# Patient Record
Sex: Female | Born: 1945 | Race: White | Hispanic: No | Marital: Married | Smoking: Never smoker
Health system: Southern US, Community
[De-identification: ages and names within clinical notes are randomized; demographics above are authoritative.]

## PROBLEM LIST (undated history)

## (undated) DIAGNOSIS — I771 Stricture of artery: Secondary | ICD-10-CM

## (undated) DIAGNOSIS — K5790 Diverticulosis of intestine, part unspecified, without perforation or abscess without bleeding: Secondary | ICD-10-CM

## (undated) DIAGNOSIS — Z889 Allergy status to unspecified drugs, medicaments and biological substances status: Secondary | ICD-10-CM

## (undated) DIAGNOSIS — K589 Irritable bowel syndrome without diarrhea: Secondary | ICD-10-CM

## (undated) DIAGNOSIS — G43909 Migraine, unspecified, not intractable, without status migrainosus: Secondary | ICD-10-CM

## (undated) DIAGNOSIS — M1990 Unspecified osteoarthritis, unspecified site: Secondary | ICD-10-CM

## (undated) DIAGNOSIS — R519 Headache, unspecified: Secondary | ICD-10-CM

## (undated) DIAGNOSIS — Z9289 Personal history of other medical treatment: Secondary | ICD-10-CM

## (undated) DIAGNOSIS — I10 Essential (primary) hypertension: Secondary | ICD-10-CM

## (undated) DIAGNOSIS — F419 Anxiety disorder, unspecified: Secondary | ICD-10-CM

## (undated) DIAGNOSIS — C801 Malignant (primary) neoplasm, unspecified: Secondary | ICD-10-CM

## (undated) DIAGNOSIS — Z9889 Other specified postprocedural states: Secondary | ICD-10-CM

## (undated) DIAGNOSIS — N6001 Solitary cyst of right breast: Secondary | ICD-10-CM

## (undated) DIAGNOSIS — H269 Unspecified cataract: Secondary | ICD-10-CM

## (undated) DIAGNOSIS — B019 Varicella without complication: Secondary | ICD-10-CM

## (undated) DIAGNOSIS — N6002 Solitary cyst of left breast: Secondary | ICD-10-CM

## (undated) DIAGNOSIS — L719 Rosacea, unspecified: Secondary | ICD-10-CM

## (undated) DIAGNOSIS — R42 Dizziness and giddiness: Secondary | ICD-10-CM

## (undated) DIAGNOSIS — J310 Chronic rhinitis: Secondary | ICD-10-CM

## (undated) DIAGNOSIS — J45909 Unspecified asthma, uncomplicated: Secondary | ICD-10-CM

## (undated) DIAGNOSIS — E785 Hyperlipidemia, unspecified: Secondary | ICD-10-CM

## (undated) DIAGNOSIS — K219 Gastro-esophageal reflux disease without esophagitis: Secondary | ICD-10-CM

## (undated) DIAGNOSIS — R51 Headache: Secondary | ICD-10-CM

## (undated) DIAGNOSIS — B351 Tinea unguium: Secondary | ICD-10-CM

## (undated) DIAGNOSIS — D649 Anemia, unspecified: Secondary | ICD-10-CM

## (undated) DIAGNOSIS — B029 Zoster without complications: Secondary | ICD-10-CM

## (undated) HISTORY — PX: JOINT REPLACEMENT: SHX530

## (undated) HISTORY — PX: ABDOMINAL HYSTERECTOMY: SHX81

## (undated) HISTORY — PX: NOSE SURGERY: SHX723

## (undated) HISTORY — PX: BREAST CYST ASPIRATION: SHX578

## (undated) HISTORY — PX: SHOULDER ARTHROSCOPY: SHX128

## (undated) HISTORY — PX: WRIST FRACTURE SURGERY: SHX121

## (undated) HISTORY — PX: FRACTURE SURGERY: SHX138

## (undated) HISTORY — PX: HIP ARTHROPLASTY: SHX981

---

## 2003-02-16 HISTORY — PX: ESOPHAGOGASTRODUODENOSCOPY: SHX1529

## 2005-03-19 ENCOUNTER — Ambulatory Visit: Payer: Self-pay | Admitting: Obstetrics and Gynecology

## 2006-06-17 ENCOUNTER — Ambulatory Visit: Payer: Self-pay | Admitting: Obstetrics and Gynecology

## 2007-06-21 ENCOUNTER — Ambulatory Visit: Payer: Self-pay | Admitting: Obstetrics and Gynecology

## 2008-10-30 ENCOUNTER — Ambulatory Visit: Payer: Self-pay | Admitting: Obstetrics and Gynecology

## 2010-01-22 ENCOUNTER — Ambulatory Visit: Payer: Self-pay | Admitting: Obstetrics and Gynecology

## 2010-07-31 ENCOUNTER — Ambulatory Visit: Payer: Self-pay | Admitting: Internal Medicine

## 2012-11-01 ENCOUNTER — Ambulatory Visit: Payer: Self-pay | Admitting: Unknown Physician Specialty

## 2012-11-07 ENCOUNTER — Ambulatory Visit: Payer: Self-pay | Admitting: Internal Medicine

## 2014-01-16 ENCOUNTER — Ambulatory Visit: Payer: Self-pay | Admitting: Family Medicine

## 2014-01-16 DIAGNOSIS — Z860101 Personal history of adenomatous and serrated colon polyps: Secondary | ICD-10-CM

## 2014-01-16 DIAGNOSIS — Z86010 Personal history of colonic polyps: Secondary | ICD-10-CM

## 2014-01-16 HISTORY — DX: Personal history of adenomatous and serrated colon polyps: Z86.0101

## 2014-01-16 HISTORY — DX: Personal history of colonic polyps: Z86.010

## 2014-07-14 DIAGNOSIS — E785 Hyperlipidemia, unspecified: Secondary | ICD-10-CM | POA: Insufficient documentation

## 2014-07-14 DIAGNOSIS — I10 Essential (primary) hypertension: Secondary | ICD-10-CM | POA: Insufficient documentation

## 2014-07-14 DIAGNOSIS — M1990 Unspecified osteoarthritis, unspecified site: Secondary | ICD-10-CM | POA: Insufficient documentation

## 2014-07-14 DIAGNOSIS — M8580 Other specified disorders of bone density and structure, unspecified site: Secondary | ICD-10-CM | POA: Insufficient documentation

## 2014-07-27 DIAGNOSIS — J45909 Unspecified asthma, uncomplicated: Secondary | ICD-10-CM | POA: Insufficient documentation

## 2015-02-20 ENCOUNTER — Ambulatory Visit: Payer: Self-pay | Admitting: Obstetrics and Gynecology

## 2015-05-31 ENCOUNTER — Ambulatory Visit
Admission: RE | Admit: 2015-05-31 | Discharge: 2015-05-31 | Disposition: A | Discharge status: Home / Self Care | Payer: Medicare Other | Source: Ambulatory Visit | Attending: Unknown Physician Specialty | Admitting: Unknown Physician Specialty

## 2015-05-31 ENCOUNTER — Ambulatory Visit: Payer: Medicare Other | Admitting: Anesthesiology

## 2015-05-31 ENCOUNTER — Encounter
Admission: RE | Disposition: A | Discharge status: Home / Self Care | Payer: Self-pay | Source: Ambulatory Visit | Attending: Unknown Physician Specialty

## 2015-05-31 ENCOUNTER — Encounter: Payer: Self-pay | Admitting: Certified Registered Nurse Anesthetist

## 2015-05-31 DIAGNOSIS — Z7982 Long term (current) use of aspirin: Secondary | ICD-10-CM | POA: Insufficient documentation

## 2015-05-31 DIAGNOSIS — Z1211 Encounter for screening for malignant neoplasm of colon: Secondary | ICD-10-CM | POA: Diagnosis not present

## 2015-05-31 DIAGNOSIS — E785 Hyperlipidemia, unspecified: Secondary | ICD-10-CM | POA: Diagnosis not present

## 2015-05-31 DIAGNOSIS — I10 Essential (primary) hypertension: Secondary | ICD-10-CM | POA: Insufficient documentation

## 2015-05-31 DIAGNOSIS — F419 Anxiety disorder, unspecified: Secondary | ICD-10-CM | POA: Insufficient documentation

## 2015-05-31 DIAGNOSIS — R51 Headache: Secondary | ICD-10-CM | POA: Insufficient documentation

## 2015-05-31 DIAGNOSIS — K5730 Diverticulosis of large intestine without perforation or abscess without bleeding: Secondary | ICD-10-CM | POA: Insufficient documentation

## 2015-05-31 DIAGNOSIS — D120 Benign neoplasm of cecum: Secondary | ICD-10-CM | POA: Diagnosis not present

## 2015-05-31 DIAGNOSIS — M1990 Unspecified osteoarthritis, unspecified site: Secondary | ICD-10-CM | POA: Insufficient documentation

## 2015-05-31 DIAGNOSIS — D122 Benign neoplasm of ascending colon: Secondary | ICD-10-CM | POA: Diagnosis not present

## 2015-05-31 DIAGNOSIS — Z86010 Personal history of colonic polyps: Secondary | ICD-10-CM | POA: Diagnosis not present

## 2015-05-31 DIAGNOSIS — D123 Benign neoplasm of transverse colon: Secondary | ICD-10-CM | POA: Insufficient documentation

## 2015-05-31 DIAGNOSIS — J45909 Unspecified asthma, uncomplicated: Secondary | ICD-10-CM | POA: Diagnosis not present

## 2015-05-31 DIAGNOSIS — Z79899 Other long term (current) drug therapy: Secondary | ICD-10-CM | POA: Diagnosis not present

## 2015-05-31 DIAGNOSIS — K219 Gastro-esophageal reflux disease without esophagitis: Secondary | ICD-10-CM | POA: Diagnosis not present

## 2015-05-31 DIAGNOSIS — K589 Irritable bowel syndrome without diarrhea: Secondary | ICD-10-CM | POA: Insufficient documentation

## 2015-05-31 DIAGNOSIS — K648 Other hemorrhoids: Secondary | ICD-10-CM | POA: Insufficient documentation

## 2015-05-31 HISTORY — DX: Stricture of artery: I77.1

## 2015-05-31 HISTORY — DX: Chronic rhinitis: J31.0

## 2015-05-31 HISTORY — DX: Malignant (primary) neoplasm, unspecified: C80.1

## 2015-05-31 HISTORY — DX: Unspecified cataract: H26.9

## 2015-05-31 HISTORY — PX: COLONOSCOPY: SHX5424

## 2015-05-31 HISTORY — DX: Diverticulosis of intestine, part unspecified, without perforation or abscess without bleeding: K57.90

## 2015-05-31 HISTORY — DX: Irritable bowel syndrome, unspecified: K58.9

## 2015-05-31 HISTORY — DX: Solitary cyst of right breast: N60.01

## 2015-05-31 HISTORY — DX: Headache: R51

## 2015-05-31 HISTORY — DX: Varicella without complication: B01.9

## 2015-05-31 HISTORY — DX: Dizziness and giddiness: R42

## 2015-05-31 HISTORY — DX: Solitary cyst of left breast: N60.02

## 2015-05-31 HISTORY — DX: Tinea unguium: B35.1

## 2015-05-31 HISTORY — DX: Zoster without complications: B02.9

## 2015-05-31 HISTORY — DX: Unspecified osteoarthritis, unspecified site: M19.90

## 2015-05-31 HISTORY — DX: Essential (primary) hypertension: I10

## 2015-05-31 HISTORY — DX: Headache, unspecified: R51.9

## 2015-05-31 HISTORY — DX: Hyperlipidemia, unspecified: E78.5

## 2015-05-31 HISTORY — DX: Rosacea, unspecified: L71.9

## 2015-05-31 HISTORY — DX: Gastro-esophageal reflux disease without esophagitis: K21.9

## 2015-05-31 HISTORY — DX: Anxiety disorder, unspecified: F41.9

## 2015-05-31 HISTORY — DX: Unspecified asthma, uncomplicated: J45.909

## 2015-05-31 HISTORY — DX: Anemia, unspecified: D64.9

## 2015-05-31 SURGERY — COLONOSCOPY
Anesthesia: General

## 2015-05-31 MED ORDER — PROPOFOL 10 MG/ML IV BOLUS
INTRAVENOUS | Status: DC | PRN
  Administered 2015-05-31: 50 mg via INTRAVENOUS

## 2015-05-31 MED ORDER — SODIUM CHLORIDE 0.9 % IV SOLN
INTRAVENOUS | Status: DC
  Administered 2015-05-31: 15:00:00 via INTRAVENOUS

## 2015-05-31 MED ORDER — SODIUM CHLORIDE 0.9 % IV SOLN: INTRAVENOUS | Status: DC

## 2015-05-31 MED ORDER — ONDANSETRON HCL 4 MG/2ML IJ SOLN
INTRAMUSCULAR | Status: DC | PRN
  Administered 2015-05-31: 4 mg via INTRAVENOUS

## 2015-05-31 MED ORDER — PROPOFOL INFUSION 10 MG/ML OPTIME
INTRAVENOUS | Status: DC | PRN
  Administered 2015-05-31: 140 ug/kg/min via INTRAVENOUS

## 2015-05-31 MED ORDER — GLYCOPYRROLATE 0.2 MG/ML IJ SOLN
INTRAMUSCULAR | Status: DC | PRN
  Administered 2015-05-31: 0.2 mg via INTRAVENOUS

## 2015-05-31 MED ORDER — PHENYLEPHRINE HCL 10 MG/ML IJ SOLN
INTRAMUSCULAR | Status: DC | PRN
  Administered 2015-05-31 (×2): 150 ug via INTRAVENOUS
  Administered 2015-05-31: 100 ug via INTRAVENOUS

## 2015-05-31 MED ORDER — MIDAZOLAM HCL 5 MG/5ML IJ SOLN
INTRAMUSCULAR | Status: DC | PRN
  Administered 2015-05-31: 1 mg via INTRAVENOUS

## 2015-05-31 MED ORDER — PIPERACILLIN-TAZOBACTAM 3.375 G IVPB 30 MIN
3.3750 g | Freq: Once | INTRAVENOUS | Status: AC
  Administered 2015-05-31: 3.375 g via INTRAVENOUS
  Filled 2015-05-31: qty 50

## 2015-05-31 NOTE — Transfer of Care (Signed)
Immediate Anesthesia Transfer of Care Note  Patient: Madison Harding  Procedure(s) Performed: Procedure(s): COLONOSCOPY (N/A)  Patient Location: PACU  Anesthesia Type:General  Level of Consciousness: awake, alert  and oriented  Airway & Oxygen Therapy: Patient Spontanous Breathing and Patient connected to nasal cannula oxygen  Post-op Assessment: Report given to RN and Post -op Vital signs reviewed and stable  Post vital signs: Reviewed and stable  Last Vitals:  Filed Vitals:   05/31/15 1607  BP:   Pulse:   Temp: 36.3 C  Resp:     Complications: No apparent anesthesia complications

## 2015-05-31 NOTE — H&P (Signed)
Primary Care Physician:  Kirk Ruths., MD Primary Gastroenterologist:  Dr. Vira Agar  Pre-Procedure History & Physical: HPI:  Madison Harding is a 69 y.o. female is here for an colonoscopy.   Past Medical History  Diagnosis Date  . GERD (gastroesophageal reflux disease)   . Asthma   . Headache   . Hypertension   . Cancer     facial  . Cataract   . Tortuous aorta   . Bilateral breast cysts   . Shingles   . Chicken pox   . Rhinitis   . Osteoarthritis   . Anemia   . IBS (irritable bowel syndrome)   . Vertigo   . Hyperlipemia   . Anxiety   . Tinea unguium   . Rosacea   . Diverticulosis     Past Surgical History  Procedure Laterality Date  . Nose surgery    . Shoulder arthroscopy Right   . Wrist fracture surgery    . Abdominal hysterectomy    . Fracture surgery    . Hip arthroplasty Left     Prior to Admission medications   Medication Sig Start Date End Date Taking? Authorizing Provider  albuterol (PROVENTIL HFA;VENTOLIN HFA) 108 (90 BASE) MCG/ACT inhaler Inhale 2 puffs into the lungs every 6 (six) hours as needed for wheezing or shortness of breath. ProAir   Yes Historical Provider, MD  aspirin EC 81 MG tablet Take 81 mg by mouth daily.   Yes Historical Provider, MD  cholecalciferol (VITAMIN D) 1000 UNITS tablet Take 1,000 Units by mouth daily.   Yes Historical Provider, MD  fluticasone (FLONASE) 50 MCG/ACT nasal spray Place 2 sprays into both nostrils daily.   Yes Historical Provider, MD  hydrochlorothiazide (HYDRODIURIL) 25 MG tablet Take 25 mg by mouth daily.   Yes Historical Provider, MD  meloxicam (MOBIC) 15 MG tablet Take 15 mg by mouth daily.   Yes Historical Provider, MD  montelukast (SINGULAIR) 10 MG tablet Take 10 mg by mouth at bedtime.   Yes Historical Provider, MD  Multiple Vitamins-Minerals (PRESERVISION/LUTEIN) CAPS Take 1 capsule by mouth 2 (two) times daily.   Yes Historical Provider, MD  Omega-3 Fatty Acids (FISH OIL) 1200 MG CAPS Take 1,200  mg by mouth 2 (two) times daily.   Yes Historical Provider, MD  ondansetron (ZOFRAN-ODT) 4 MG disintegrating tablet Take 4 mg by mouth every 8 (eight) hours as needed for nausea or vomiting.   Yes Historical Provider, MD  polyethylene glycol-electrolytes (NULYTELY/GOLYTELY) 420 G solution  02/13/15   Historical Provider, MD    Allergies as of 04/30/2015  . (Not on File)    No family history on file.  History   Social History  . Marital Status: Married    Spouse Name: N/A  . Number of Children: N/A  . Years of Education: N/A   Occupational History  . Not on file.   Social History Main Topics  . Smoking status: Not on file  . Smokeless tobacco: Not on file  . Alcohol Use: Not on file  . Drug Use: Not on file  . Sexual Activity: Not on file   Other Topics Concern  . Not on file   Social History Narrative  . No narrative on file    Review of Systems: See HPI, otherwise negative ROS  Physical Exam: BP 127/81 mmHg  Pulse 79  Temp(Src) 97.3 F (36.3 C) (Tympanic)  Resp 18  Ht 5\' 7"  (1.702 m)  Wt 70.308 kg (155 lb)  BMI 24.27  kg/m2  SpO2 100% General:   Alert,  pleasant and cooperative in NAD Head:  Normocephalic and atraumatic. Neck:  Supple; no masses or thyromegaly. Lungs:  Clear throughout to auscultation.    Heart:  Regular rate and rhythm. Abdomen:  Soft, nontender and nondistended. Normal bowel sounds, without guarding, and without rebound.   Neurologic:  Alert and  oriented x4;  grossly normal neurologically.  Impression/Plan: Madison Harding is here for an colonoscopy to be performed for Personal history of colon polyps  Risks, benefits, limitations, and alternatives regarding  colonoscopy have been reviewed with the patient.  Questions have been answered.  All parties agreeable.   Gaylyn Cheers, MD  05/31/2015, 3:07 PM

## 2015-05-31 NOTE — Anesthesia Preprocedure Evaluation (Signed)
Anesthesia Evaluation  Patient identified by MRN, date of birth, ID band Patient awake    Reviewed: Allergy & Precautions, NPO status , Patient's Chart, lab work & pertinent test results  History of Anesthesia Complications (+) PONV  Airway Mallampati: II  TM Distance: >3 FB Neck ROM: Full    Dental  (+) Teeth Intact   Pulmonary asthma (last inhaler 2-3 months ago) ,          Cardiovascular hypertension, Pt. on medications     Neuro/Psych Anxiety    GI/Hepatic GERD- (remote hx)  ,  Endo/Other    Renal/GU      Musculoskeletal   Abdominal   Peds  Hematology  (+) anemia ,   Anesthesia Other Findings   Reproductive/Obstetrics                             Anesthesia Physical Anesthesia Plan  ASA: II  Anesthesia Plan: General   Post-op Pain Management:    Induction: Intravenous  Airway Management Planned: Nasal Cannula  Additional Equipment:   Intra-op Plan:   Post-operative Plan:   Informed Consent: I have reviewed the patients History and Physical, chart, labs and discussed the procedure including the risks, benefits and alternatives for the proposed anesthesia with the patient or authorized representative who has indicated his/her understanding and acceptance.     Plan Discussed with:   Anesthesia Plan Comments:         Anesthesia Quick Evaluation

## 2015-05-31 NOTE — Op Note (Signed)
[facility] Patient Name: Madison Harding Procedure Date: 05/31/2015 3:08 PM MRN: [id number] Account #: [id number] Date of Birth: 06/18/1946 Admit Type: Outpatient Age: 68 Room: [facility] ENDO ROOM 4 Gender: Female Note Status: Finalized Procedure:         Colonoscopy Indications:       Personal history of colonic polyps Providers:         Manya Silvas, MD Referring MD:      Ocie Cornfield. Ouida Sills, MD (Referring MD) Medicines:         Propofol per Anesthesia Complications:     No immediate complications. Procedure:         Pre-Anesthesia Assessment:                    - After reviewing the risks and benefits, the patient was                     deemed in satisfactory condition to undergo the procedure.                    After obtaining informed consent, the colonoscope was                     passed under direct vision. Throughout the procedure, the                     patient's blood pressure, pulse, and oxygen saturations                     were monitored continuously. The Olympus PCF-H180AL                     colonoscope ( S#: [id number] ) was introduced through the                     anus and advanced to the the cecum, identified by                     appendiceal orifice and ileocecal valve. The colonoscopy                     was somewhat difficult due to restricted mobility of the                     colon. Successful completion of the procedure was aided by                     applying abdominal pressure. The patient tolerated the                     procedure well. The quality of the bowel preparation was                     good. Findings:      Three sessile polyps were found in the ascending colon and in the cecum.       The polyps were small in size. These polyps were removed with a cold       snare. Resection and retrieval were complete. 2 clips applied to the       largest site.      A medium polyp was found in the ascending colon.  The polyp was sessile.       The polyp was removed with a hot snare. Resection and retrieval  were       complete.      A small polyp was found in the transverse colon. The polyp was sessile.       The polyp was removed with a hot snare. Resection and retrieval were       complete. One hemostatic clip was successfully placed.      Multiple small-mouthed diverticula were found in the sigmoid colon.      Internal hemorrhoids were found during endoscopy. The hemorrhoids were       small and Grade I (internal hemorrhoids that do not prolapse). Impression:        - Multiple polyps in the ascending colon.                    - Three small polyps in the ascending colon and in the                     cecum. Resected and retrieved.                    - One medium polyp in the ascending colon. Resected and                     retrieved.                    - One small polyp in the transverse colon. Resected and                     retrieved. Clip was placed.                    - Diverticulosis in the sigmoid colon.                    - Internal hemorrhoids. Recommendation:    - Await pathology results. Manya Silvas, MD 05/31/2015 4:07:36 PM This report has been signed electronically. Number of Addenda: 0 Note Initiated On: 05/31/2015 3:08 PM Scope Withdrawal Time: 0 hours 36 minutes 44 seconds  Total Procedure Duration: 0 hours 45 minutes 55 seconds       [facility]

## 2015-05-31 NOTE — Anesthesia Postprocedure Evaluation (Signed)
  Anesthesia Post-op Note  Patient: Madison Harding  Procedure(s) Performed: Procedure(s): COLONOSCOPY (N/A)  Anesthesia type:General  Patient location: PACU  Post pain: Pain level controlled  Post assessment: Post-op Vital signs reviewed, Patient's Cardiovascular Status Stable, Respiratory Function Stable, Patent Airway and No signs of Nausea or vomiting  Post vital signs: Reviewed and stable  Last Vitals:  Filed Vitals:   05/31/15 1607  BP:   Pulse:   Temp: 36.3 C  Resp:     Level of consciousness: awake, alert  and patient cooperative  Complications: No apparent anesthesia complications

## 2015-06-04 LAB — SURGICAL PATHOLOGY

## 2015-06-05 ENCOUNTER — Encounter: Payer: Self-pay | Admitting: Unknown Physician Specialty

## 2015-06-10 NOTE — Addendum Note (Signed)
Addendum  created 06/10/15 1554 by Alvin Critchley, MD   Modules edited: Anesthesia Attestations

## 2015-08-01 DIAGNOSIS — Z96642 Presence of left artificial hip joint: Secondary | ICD-10-CM | POA: Insufficient documentation

## 2015-09-30 ENCOUNTER — Encounter: Payer: Self-pay | Admitting: Podiatry

## 2015-09-30 ENCOUNTER — Ambulatory Visit (INDEPENDENT_AMBULATORY_CARE_PROVIDER_SITE_OTHER): Payer: Medicare Other | Admitting: Podiatry

## 2015-09-30 ENCOUNTER — Ambulatory Visit (INDEPENDENT_AMBULATORY_CARE_PROVIDER_SITE_OTHER): Payer: Medicare Other

## 2015-09-30 VITALS — BP 130/80 | HR 56 | Resp 16

## 2015-09-30 DIAGNOSIS — M79672 Pain in left foot: Secondary | ICD-10-CM | POA: Diagnosis not present

## 2015-09-30 DIAGNOSIS — S92912A Unspecified fracture of left toe(s), initial encounter for closed fracture: Secondary | ICD-10-CM | POA: Diagnosis not present

## 2015-09-30 DIAGNOSIS — M779 Enthesopathy, unspecified: Secondary | ICD-10-CM
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
## 2016-01-01 ENCOUNTER — Encounter: Payer: Self-pay | Admitting: Obstetrics and Gynecology

## 2018-06-01 ENCOUNTER — Other Ambulatory Visit: Payer: Self-pay | Admitting: Internal Medicine

## 2018-06-01 DIAGNOSIS — Z1231 Encounter for screening mammogram for malignant neoplasm of breast: Secondary | ICD-10-CM

## 2018-06-17 ENCOUNTER — Ambulatory Visit
Admission: RE | Admit: 2018-06-17 | Discharge: 2018-06-17 | Disposition: A | Discharge status: Home / Self Care | Payer: Medicare Other | Source: Ambulatory Visit | Attending: Internal Medicine | Admitting: Internal Medicine

## 2018-06-17 DIAGNOSIS — Z1231 Encounter for screening mammogram for malignant neoplasm of breast: Secondary | ICD-10-CM | POA: Diagnosis not present

## 2018-06-29 ENCOUNTER — Encounter: Payer: Self-pay | Admitting: Podiatry

## 2018-06-29 ENCOUNTER — Ambulatory Visit (INDEPENDENT_AMBULATORY_CARE_PROVIDER_SITE_OTHER): Payer: Medicare Other

## 2018-06-29 ENCOUNTER — Encounter

## 2018-06-29 ENCOUNTER — Ambulatory Visit (INDEPENDENT_AMBULATORY_CARE_PROVIDER_SITE_OTHER): Payer: Medicare Other | Admitting: Podiatry

## 2018-06-29 DIAGNOSIS — M898X9 Other specified disorders of bone, unspecified site: Secondary | ICD-10-CM

## 2018-06-29 DIAGNOSIS — M1990 Unspecified osteoarthritis, unspecified site: Secondary | ICD-10-CM | POA: Diagnosis not present

## 2018-06-29 DIAGNOSIS — M778 Other enthesopathies, not elsewhere classified: Secondary | ICD-10-CM

## 2018-06-29 DIAGNOSIS — M779 Enthesopathy, unspecified: Secondary | ICD-10-CM

## 2018-06-29 NOTE — Progress Notes (Signed)
Subjective:  Patient ID: Madison Harding, female    DOB: 1946/06/27,  MRN: [id number] HPI Chief Complaint  Patient presents with  . Foot Pain    Patient presents today for knot on top of left foot.  She reports she noticed it about 3-4 weeks ago and stated "its just looks weird"  She denies any pain or tenderness     72 y.o. female presents with the above complaint.   ROS: She denies fever chills nausea vomiting muscle aches pains calf pain back pain chest pain shortness of breath.  Past Medical History:  Diagnosis Date  . Anemia   . Anxiety   . Asthma   . Bilateral breast cysts   . Cancer (HCC)    facial  . Cataract   . Chicken pox   . Diverticulosis   . GERD (gastroesophageal reflux disease)   . Headache   . Hyperlipemia   . Hypertension   . IBS (irritable bowel syndrome)   . Osteoarthritis   . Rhinitis   . Rosacea   . Shingles   . Tinea unguium   . Tortuous aorta (HCC)   . Vertigo    Past Surgical History:  Procedure Laterality Date  . ABDOMINAL HYSTERECTOMY    . BREAST CYST ASPIRATION    . COLONOSCOPY N/A 05/31/2015   Procedure: COLONOSCOPY;  Surgeon: Manya Silvas, MD;  Location: [facility] ENDOSCOPY;  Service: Endoscopy;  Laterality: N/A;  . FRACTURE SURGERY    . HIP ARTHROPLASTY Left   . NOSE SURGERY    . SHOULDER ARTHROSCOPY Right   . WRIST FRACTURE SURGERY      Current Outpatient Medications:  .  loratadine (CLARITIN) 10 MG tablet, Take by mouth., Disp: , Rfl:  .  albuterol (PROVENTIL HFA;VENTOLIN HFA) 108 (90 BASE) MCG/ACT inhaler, Inhale 2 puffs into the lungs every 6 (six) hours as needed for wheezing or shortness of breath. ProAir, Disp: , Rfl:  .  aspirin EC 81 MG tablet, Take 81 mg by mouth daily., Disp: , Rfl:  .  cholecalciferol (VITAMIN D) 1000 UNITS tablet, Take 1,000 Units by mouth daily., Disp: , Rfl:  .  fluticasone (FLONASE) 50 MCG/ACT nasal spray, Place 2 sprays into both nostrils daily., Disp: , Rfl:  .  hydrochlorothiazide (HYDRODIURIL)  25 MG tablet, Take 25 mg by mouth daily., Disp: , Rfl:  .  montelukast (SINGULAIR) 10 MG tablet, Take 10 mg by mouth at bedtime., Disp: , Rfl:  .  Multiple Vitamins-Minerals (PRESERVISION/LUTEIN) CAPS, Take 1 capsule by mouth 2 (two) times daily., Disp: , Rfl:  .  Omega-3 Fatty Acids (FISH OIL) 1200 MG CAPS, Take 1,200 mg by mouth 2 (two) times daily., Disp: , Rfl:  .  ondansetron (ZOFRAN-ODT) 4 MG disintegrating tablet, Take 4 mg by mouth every 8 (eight) hours as needed. for nausea, Disp: , Rfl: 0  Allergies  Allergen Reactions  . Bee Venom Swelling  . Codeine Nausea And Vomiting    Severe nausea and vomiting  . Nsaids Other (See Comments)    reflux  . Xanax [Alprazolam] Other (See Comments)    sedation   Review of Systems Objective:  There were no vitals filed for this visit.  General: Well developed, nourished, in no acute distress, alert and oriented x3   Dermatological: Skin is warm, dry and supple bilateral. Nails x 10 are well maintained; remaining integument appears unremarkable at this time. There are no open sores, no preulcerative lesions, no rash or signs of infection present.  Vascular: Dorsalis Pedis artery and Posterior Tibial artery pedal pulses are 2/4 bilateral with immedate capillary fill time. Pedal hair growth present. No varicosities and no lower extremity edema present bilateral.   Neruologic: Grossly intact via light touch bilateral. Vibratory intact via tuning fork bilateral. Protective threshold with Semmes Wienstein monofilament intact to all pedal sites bilateral. Patellar and Achilles deep tendon reflexes 2+ bilateral. No Babinski or clonus noted bilateral.   Musculoskeletal: No gross boney pedal deformities bilateral. No pain, crepitus, or limitation noted with foot and ankle range of motion bilateral. Muscular strength 5/5 in all groups tested bilateral.  Dorsal spur nontender the base of the second metatarsal and intermediate cuneiform.  Gait: Unassisted,  Nonantalgic.    Radiographs:  Radiographs taken today demonstrate joint space narrowing second metatarsal tarsal joint.  There appears to be some dorsal spurring and possibly even fragmentation of the bone there.  Soft tissue swelling above this.  Assessment & Plan:   Assessment: Osteoarthritis second tarsometatarsal joint left foot.  Plan: Discussed appropriate shoe gear stretching exercise ice type shoe modifications offered her injection therapy she declined.  Follow-up with her on an as-needed basis.     Eldra Word T. [place], [place]

## 2020-06-18 DIAGNOSIS — F411 Generalized anxiety disorder: Secondary | ICD-10-CM | POA: Insufficient documentation

## 2020-10-02 ENCOUNTER — Other Ambulatory Visit: Payer: Self-pay | Admitting: Internal Medicine

## 2020-10-02 DIAGNOSIS — Z1231 Encounter for screening mammogram for malignant neoplasm of breast: Secondary | ICD-10-CM

## 2021-07-10 ENCOUNTER — Other Ambulatory Visit: Payer: Self-pay | Admitting: Internal Medicine

## 2021-07-10 DIAGNOSIS — Z1231 Encounter for screening mammogram for malignant neoplasm of breast: Secondary | ICD-10-CM

## 2021-07-18 ENCOUNTER — Other Ambulatory Visit: Payer: Self-pay

## 2021-07-18 ENCOUNTER — Ambulatory Visit
Admission: RE | Admit: 2021-07-18 | Discharge: 2021-07-18 | Disposition: A | Discharge status: Home / Self Care | Payer: Medicare Other | Source: Ambulatory Visit | Attending: Internal Medicine | Admitting: Internal Medicine

## 2021-07-18 DIAGNOSIS — Z1231 Encounter for screening mammogram for malignant neoplasm of breast: Secondary | ICD-10-CM | POA: Diagnosis present

## 2022-04-15 ENCOUNTER — Encounter: Payer: Self-pay | Admitting: Gastroenterology

## 2022-04-15 NOTE — H&P (Signed)
? ?Pre-Procedure H&P ?  ?Patient ID: Madison Harding is a 76 y.o. female. ? ?Gastroenterology Provider: Annamaria Helling, DO ? ?Referring Provider: Laurine Blazer, PA ?PCP: Kirk Ruths, MD ? ?Date: 04/16/2022 ? ?HPI ?[name] is a 76 y.o. female who presents today for Colonoscopy for Surveillance-personal history of colon polyps; family history of colon polyps-brother. ?Patient with daily bowel movement without melena hematochezia constipation diarrhea or abdominal pain.  Family history of colon polyps in her brother.  She underwent colonoscopy in 2004 which was normal. ?November 2013 1 cm tubular adenoma that was removed.  Underwent colonoscopy in 2016 demonstrating polyps.  Repeat colonoscopy in 2019 demonstrating 12 mm distal ascending colon polyp and smaller descending polyp.  Pandiverticulosis noted ? ?Status post hysterectomy.  Status post left hip replacement ?Creatinine 0.8 ? ? ?Past Medical History:  ?Diagnosis Date  ? Allergic genetic state   ? Anemia   ? Anxiety   ? Asthma   ? Bilateral breast cysts   ? Cancer [facility])   ? facial skin  ? Cataract cortical, senile   ? Chicken pox   ? Diverticulosis   ? GERD (gastroesophageal reflux disease)   ? Headache   ? History of adenomatous polyp of colon 01/16/2014  ? History of bone density study   ? History of mammogram   ? Hyperlipemia   ? Hypertension   ? IBS (irritable bowel syndrome)   ? IBS (irritable bowel syndrome)   ? Migraines   ? Osteoarthritis   ? hands, feet, hip, lumbar spine  ? PONV (postoperative nausea and vomiting)   ? Rhinitis   ? Rosacea   ? Shingles   ? Tinea unguium   ? Tortuous aorta (HCC)   ? Vertigo   ? ? ?Past Surgical History:  ?Procedure Laterality Date  ? ABDOMINAL HYSTERECTOMY    ? BREAST CYST ASPIRATION    ? COLONOSCOPY N/A 05/31/2015  ? Procedure: COLONOSCOPY;  Surgeon: Manya Silvas, MD;  Location: [facility] ENDOSCOPY;  Service: Endoscopy;  Laterality: N/A;  ? ESOPHAGOGASTRODUODENOSCOPY  02/16/2003  ? FRACTURE  SURGERY    ? HIP ARTHROPLASTY Left   ? JOINT REPLACEMENT    ? hip  ? NOSE SURGERY    ? SHOULDER ARTHROSCOPY Right   ? WRIST FRACTURE SURGERY    ? ? ?Family History ?Father-history of colon polyps ?No other h/o GI disease or malignancy ? ?Review of Systems  ?Constitutional:  Negative for activity change, appetite change, chills, diaphoresis, fatigue, fever and unexpected weight change.  ?HENT:  Negative for trouble swallowing and voice change.   ?Respiratory:  Negative for shortness of breath and wheezing.   ?Cardiovascular:  Negative for chest pain, palpitations and leg swelling.  ?Gastrointestinal:  Negative for abdominal distention, abdominal pain, anal bleeding, blood in stool, constipation, diarrhea, nausea, rectal pain and vomiting.  ?Musculoskeletal:  Negative for arthralgias and myalgias.  ?Skin:  Negative for color change and pallor.  ?Neurological:  Negative for dizziness, syncope and weakness.  ?Psychiatric/Behavioral:  Negative for confusion.   ?All other systems reviewed and are negative.  ? ?Medications ?No current facility-administered medications on file prior to encounter.  ? ?Current Outpatient Medications on File Prior to Encounter  ?Medication Sig Dispense Refill  ? albuterol (PROVENTIL HFA;VENTOLIN HFA) 108 (90 BASE) MCG/ACT inhaler Inhale 2 puffs into the lungs every 6 (six) hours as needed for wheezing or shortness of breath. ProAir    ? aspirin EC 81 MG tablet Take 81 mg by mouth  daily.    ? cholecalciferol (VITAMIN D) 1000 UNITS tablet Take 1,000 Units by mouth daily.    ? escitalopram (LEXAPRO) 10 MG tablet Take 10 mg by mouth daily.    ? fluticasone (FLONASE) 50 MCG/ACT nasal spray Place 2 sprays into both nostrils daily.    ? hydrochlorothiazide (HYDRODIURIL) 25 MG tablet Take 25 mg by mouth daily.    ? Magnesium 200 MG CHEW Chew by mouth at bedtime.    ? montelukast (SINGULAIR) 10 MG tablet Take 10 mg by mouth at bedtime.    ? Multiple Vitamins-Minerals (PRESERVISION/LUTEIN) CAPS Take 1  capsule by mouth 2 (two) times daily.    ? Omega-3 Fatty Acids (FISH OIL) 1200 MG CAPS Take 1,200 mg by mouth 2 (two) times daily.    ? omeprazole (PRILOSEC) 20 MG capsule Take 20 mg by mouth daily.    ? ondansetron (ZOFRAN-ODT) 4 MG disintegrating tablet Take 4 mg by mouth every 8 (eight) hours as needed. for nausea  0  ? loratadine (CLARITIN) 10 MG tablet Take by mouth.    ? ? ?Pertinent medications related to GI and procedure were reviewed by me with the patient prior to the procedure ? ? ?Current Facility-Administered Medications:  ?  0.9 %  sodium chloride infusion, , Intravenous, Continuous, Annamaria Helling, DO, Last Rate: 20 mL/hr at 04/16/22 0806, 20 mL/hr at 04/16/22 7510 ?  ?  ? ?Allergies  ?Allergen Reactions  ? Bee Venom Swelling  ? Codeine Nausea And Vomiting  ?  Severe nausea and vomiting  ? Nsaids Other (See Comments)  ?  reflux  ? Xanax [Alprazolam] Other (See Comments)  ?  sedation  ? ?Allergies were reviewed by me prior to the procedure ? ?Objective  ? ?Body mass index is 28.68 kg/m?. ?Vitals:  ? 04/16/22 0754  ?BP: (!) 183/91  ?Pulse: 71  ?Resp: 20  ?Temp: (!) 97.3 ?F (36.3 ?C)  ?TempSrc: Temporal  ?SpO2: 100%  ?Weight: 79.4 kg  ?Height: 5' 5.5" (1.664 m)  ? ? ? ?Physical Exam ?Vitals and nursing note reviewed.  ?Constitutional:   ?   General: She is not in acute distress. ?   Appearance: Normal appearance. She is not ill-appearing, toxic-appearing or diaphoretic.  ?HENT:  ?   Head: Normocephalic and atraumatic.  ?   Nose: Nose normal.  ?   Mouth/Throat:  ?   Mouth: Mucous membranes are moist.  ?   Pharynx: Oropharynx is clear.  ?Eyes:  ?   General: No scleral icterus. ?   Extraocular Movements: Extraocular movements intact.  ?Cardiovascular:  ?   Rate and Rhythm: Normal rate and regular rhythm.  ?   Heart sounds: Normal heart sounds. No murmur heard. ?  No friction rub. No gallop.  ?Pulmonary:  ?   Effort: Pulmonary effort is normal. No respiratory distress.  ?   Breath sounds: Normal  breath sounds. No wheezing, rhonchi or rales.  ?Abdominal:  ?   General: Bowel sounds are normal. There is no distension.  ?   Palpations: Abdomen is soft.  ?   Tenderness: There is no abdominal tenderness. There is no guarding or rebound.  ?Musculoskeletal:  ?   Cervical back: Neck supple.  ?   Right lower leg: No edema.  ?   Left lower leg: No edema.  ?Skin: ?   General: Skin is warm and dry.  ?   Coloration: Skin is not jaundiced or pale.  ?Neurological:  ?   General: No focal  deficit present.  ?   Mental Status: She is alert and oriented to person, place, and time. Mental status is at baseline.  ?Psychiatric:     ?   Mood and Affect: Mood normal.     ?   Behavior: Behavior normal.     ?   Thought Content: Thought content normal.     ?   Judgment: Judgment normal.  ? ? ? ?Assessment:  ?Ms. GIANNE SHUGARS is a 76 y.o. female  who presents today for Colonoscopy for Surveillance-personal history of colon polyps; family history of colon polyps-brother. ? ?Plan:  ?Colonoscopy with possible intervention today ? ?Colonoscopy with possible biopsy, control of bleeding, polypectomy, and interventions as necessary has been discussed with the patient/patient representative. Informed consent was obtained from the patient/patient representative after explaining the indication, nature, and risks of the procedure including but not limited to death, bleeding, perforation, missed neoplasm/lesions, cardiorespiratory compromise, and reaction to medications. Opportunity for questions was given and appropriate answers were provided. Patient/patient representative has verbalized understanding is amenable to undergoing the procedure. ? ? ?Annamaria Helling, DO  ?[facility] ? ?Portions of the record may have been created with voice recognition software. Occasional wrong-word or 'sound-a-like' substitutions may have occurred due to the inherent limitations of voice recognition software.  Read the chart carefully  and recognize, using context, where substitutions may have occurred. ?

## 2022-04-16 ENCOUNTER — Ambulatory Visit
Admission: RE | Admit: 2022-04-16 | Discharge: 2022-04-16 | Disposition: A | Discharge status: Home / Self Care | Payer: Medicare Other | Attending: Gastroenterology | Admitting: Gastroenterology

## 2022-04-16 ENCOUNTER — Encounter
Admission: RE | Disposition: A | Discharge status: Home / Self Care | Payer: Self-pay | Source: Home / Self Care | Attending: Gastroenterology

## 2022-04-16 ENCOUNTER — Encounter: Payer: Self-pay | Admitting: Gastroenterology

## 2022-04-16 ENCOUNTER — Ambulatory Visit: Payer: Medicare Other | Admitting: Anesthesiology

## 2022-04-16 DIAGNOSIS — I10 Essential (primary) hypertension: Secondary | ICD-10-CM | POA: Insufficient documentation

## 2022-04-16 DIAGNOSIS — K644 Residual hemorrhoidal skin tags: Secondary | ICD-10-CM | POA: Insufficient documentation

## 2022-04-16 DIAGNOSIS — K5730 Diverticulosis of large intestine without perforation or abscess without bleeding: Secondary | ICD-10-CM | POA: Diagnosis not present

## 2022-04-16 DIAGNOSIS — K589 Irritable bowel syndrome without diarrhea: Secondary | ICD-10-CM | POA: Diagnosis not present

## 2022-04-16 DIAGNOSIS — Z8371 Family history of colonic polyps: Secondary | ICD-10-CM | POA: Insufficient documentation

## 2022-04-16 DIAGNOSIS — Z8619 Personal history of other infectious and parasitic diseases: Secondary | ICD-10-CM | POA: Insufficient documentation

## 2022-04-16 DIAGNOSIS — D120 Benign neoplasm of cecum: Secondary | ICD-10-CM | POA: Diagnosis not present

## 2022-04-16 DIAGNOSIS — Z90710 Acquired absence of both cervix and uterus: Secondary | ICD-10-CM | POA: Insufficient documentation

## 2022-04-16 DIAGNOSIS — K640 First degree hemorrhoids: Secondary | ICD-10-CM | POA: Diagnosis not present

## 2022-04-16 DIAGNOSIS — Z96642 Presence of left artificial hip joint: Secondary | ICD-10-CM | POA: Diagnosis not present

## 2022-04-16 DIAGNOSIS — Z1211 Encounter for screening for malignant neoplasm of colon: Secondary | ICD-10-CM | POA: Insufficient documentation

## 2022-04-16 DIAGNOSIS — K219 Gastro-esophageal reflux disease without esophagitis: Secondary | ICD-10-CM | POA: Insufficient documentation

## 2022-04-16 DIAGNOSIS — E785 Hyperlipidemia, unspecified: Secondary | ICD-10-CM | POA: Diagnosis not present

## 2022-04-16 HISTORY — PX: COLONOSCOPY WITH PROPOFOL: SHX5780

## 2022-04-16 HISTORY — DX: Personal history of other medical treatment: Z92.89

## 2022-04-16 HISTORY — DX: Migraine, unspecified, not intractable, without status migrainosus: G43.909

## 2022-04-16 HISTORY — DX: Allergy status to unspecified drugs, medicaments and biological substances: Z88.9

## 2022-04-16 HISTORY — DX: Other specified postprocedural states: Z98.890

## 2022-04-16 SURGERY — COLONOSCOPY WITH PROPOFOL
Anesthesia: General

## 2022-04-16 MED ORDER — PROPOFOL 500 MG/50ML IV EMUL
INTRAVENOUS | Status: DC | PRN
  Administered 2022-04-16: 165 ug/kg/min via INTRAVENOUS

## 2022-04-16 MED ORDER — SODIUM CHLORIDE 0.9 % IV SOLN
INTRAVENOUS | Status: DC
  Administered 2022-04-16: 20 mL/h via INTRAVENOUS

## 2022-04-16 MED ORDER — LIDOCAINE HCL (CARDIAC) PF 100 MG/5ML IV SOSY
PREFILLED_SYRINGE | INTRAVENOUS | Status: DC | PRN
  Administered 2022-04-16: 40 mg via INTRAVENOUS

## 2022-04-16 MED ORDER — PROPOFOL 500 MG/50ML IV EMUL
INTRAVENOUS | Status: AC
  Filled 2022-04-16: qty 50

## 2022-04-16 MED ORDER — PROPOFOL 10 MG/ML IV BOLUS
INTRAVENOUS | Status: DC | PRN
  Administered 2022-04-16: 40 mg via INTRAVENOUS
  Administered 2022-04-16: 60 mg via INTRAVENOUS

## 2022-04-16 NOTE — Transfer of Care (Signed)
Immediate Anesthesia Transfer of Care Note ? ?Patient: Madison Harding ? ?Procedure(s) Performed: COLONOSCOPY WITH PROPOFOL ? ?Patient Location: Endoscopy Unit ? ?Anesthesia Type:General ? ?Level of Consciousness: awake ? ?Airway & Oxygen Therapy: Patient Spontanous Breathing ? ?Post-op Assessment: Report given to RN and Post -op Vital signs reviewed and stable ? ?Post vital signs: Reviewed and stable ? ?Last Vitals:  ?Vitals Value Taken Time  ?BP 131/83 04/16/22 0930  ?Temp 35.9 ?C 04/16/22 0930  ?Pulse 60 04/16/22 0930  ?Resp 24 04/16/22 0930  ?SpO2 100 % 04/16/22 0930  ? ? ?Last Pain:  ?Vitals:  ? 04/16/22 0930  ?TempSrc: Temporal  ?PainSc: 0-No pain  ?   ? ?  ? ?Complications: No notable events documented. ?

## 2022-04-16 NOTE — Op Note (Signed)
[facility] ?Gastroenterology ?Patient Name: Madison Harding ?Procedure Date: 04/16/2022 8:51 AM ?MRN: [id number] ?Account #: [id number] ?Date of Birth: March 29, 1946 ?Admit Type: Outpatient ?Age: 76 ?Room: [facility] ENDO ROOM 1 ?Gender: Female ?Note Status: Finalized ?Instrument Name: Colonoscope [id number] ?Procedure:             Colonoscopy ?Indications:           High risk colon cancer surveillance: Personal history  ?                       of colonic polyps, Family history of colonic polyps in  ?                       a first-degree relative ?Providers:             Annamaria Helling DO, DO ?Referring MD:          Ocie Cornfield. Ouida Sills MD, MD (Referring MD) ?Medicines:             Monitored Anesthesia Care ?Complications:         No immediate complications. Estimated blood loss:  ?                       Minimal. ?Procedure:             Pre-Anesthesia Assessment: ?                       - Prior to the procedure, a History and Physical was  ?                       performed, and patient medications and allergies were  ?                       reviewed. The patient is competent. The risks and  ?                       benefits of the procedure and the sedation options and  ?                       risks were discussed with the patient. All questions  ?                       were answered and informed consent was obtained.  ?                       Patient identification and proposed procedure were  ?                       verified by the physician, the nurse, the anesthetist  ?                       and the technician in the endoscopy suite. Mental  ?                       Status Examination: alert and oriented. Airway  ?                       Examination: normal oropharyngeal airway and neck  ?  mobility. Respiratory Examination: clear to  ?                       auscultation. CV Examination: RRR, no murmurs, no S3  ?                       or S4. Prophylactic Antibiotics: The patient does  not  ?                       require prophylactic antibiotics. Prior  ?                       Anticoagulants: The patient has taken no previous  ?                       anticoagulant or antiplatelet agents. ASA Grade  ?                       Assessment: II - A patient with mild systemic disease.  ?                       After reviewing the risks and benefits, the patient  ?                       was deemed in satisfactory condition to undergo the  ?                       procedure. The anesthesia plan was to use monitored  ?                       anesthesia care (MAC). Immediately prior to  ?                       administration of medications, the patient was  ?                       re-assessed for adequacy to receive sedatives. The  ?                       heart rate, respiratory rate, oxygen saturations,  ?                       blood pressure, adequacy of pulmonary ventilation, and  ?                       response to care were monitored throughout the  ?                       procedure. The physical status of the patient was  ?                       re-assessed after the procedure. ?                       After obtaining informed consent, the colonoscope was  ?                       passed under direct vision. Throughout the procedure,  ?  the patient's blood pressure, pulse, and oxygen  ?                       saturations were monitored continuously. The  ?                       Colonoscope was introduced through the anus and  ?                       advanced to the the cecum, identified by appendiceal  ?                       orifice and ileocecal valve. The colonoscopy was  ?                       performed without difficulty. The patient tolerated  ?                       the procedure well. The quality of the bowel  ?                       preparation was evaluated using the BBPS [facility] Bowel  ?                       Preparation Scale) with scores of: Right Colon = 3,  ?                        Transverse Colon = 3 and Left Colon = 3 (entire mucosa  ?                       seen well with no residual staining, small fragments  ?                       of stool or opaque liquid). The total BBPS score  ?                       equals 9. The ileocecal valve, appendiceal orifice,  ?                       and rectum were photographed. ?Findings: ?     Skin tags were found on perianal exam. ?     The digital rectal exam was normal. Pertinent negatives include normal  ?     sphincter tone. ?     A 1 to 2 mm polyp was found in the cecum. The polyp was sessile. The  ?     polyp was removed with a jumbo cold forceps. Resection and retrieval  ?     were complete. Estimated blood loss was minimal. ?     Multiple small-mouthed diverticula were found in the sigmoid colon.  ?     Estimated blood loss: none. ?     Non-bleeding internal hemorrhoids were found during retroflexion. The  ?     hemorrhoids were Grade I (internal hemorrhoids that do not prolapse).  ?     Estimated blood loss: none. ?     The exam was otherwise without abnormality on direct and retroflexion  ?     views. ?Impression:            -  Perianal skin tags found on perianal exam. ?                       - One 1 to 2 mm polyp in the cecum, removed with a  ?                       jumbo cold forceps. Resected and retrieved. ?                       - Diverticulosis in the sigmoid colon. ?                       - Non-bleeding internal hemorrhoids. ?                       - The examination was otherwise normal on direct and  ?                       retroflexion views. ?Recommendation:        - Discharge patient to home. ?                       - Resume previous diet. ?                       - Continue present medications. ?                       - Await pathology results. ?                       - Repeat colonoscopy for surveillance based on  ?                       pathology results. ?                       - Return to referring physician as previously   ?                       scheduled. ?                       - The findings and recommendations were discussed with  ?                       the patient. ?Procedure Code(s):     --- Professional --- ?                       [phone], Colonoscopy, flexible; with biopsy, single or  ?                       multiple ?Diagnosis Code(s):     --- Professional --- ?                       Z86.010, Personal history of colonic polyps ?                       K63.5, Polyp of colon ?  K64.0, First degree hemorrhoids ?                       K64.4, Residual hemorrhoidal skin tags ?                       Z83.71, Family history of colonic polyps ?                       K57.30, Diverticulosis of large intestine without  ?                       perforation or abscess without bleeding ?CPT copyright 2019 American Medical Association. All rights reserved. ?The codes documented in this report are preliminary and upon coder review may  ?be revised to meet current compliance requirements. ?Attending Participation: ?     I personally performed the entire procedure. ?Volney American, DO ?Annamaria Helling DO, DO ?04/16/2022 9:29:32 AM ?This report has been signed electronically. ?Number of Addenda: 0 ?Note Initiated On: 04/16/2022 8:51 AM ?Scope Withdrawal Time: 0 hours 18 minutes 29 seconds  ?Total Procedure Duration: 0 hours 24 minutes 23 seconds  ?Estimated Blood Loss:  Estimated blood loss was minimal. ?     [facility] ?

## 2022-04-16 NOTE — Anesthesia Postprocedure Evaluation (Signed)
Anesthesia Post Note ? ?Patient: ELLAREE GEAR ? ?Procedure(s) Performed: COLONOSCOPY WITH PROPOFOL ? ?Patient location during evaluation: Endoscopy ?Anesthesia Type: General ?Level of consciousness: awake and alert ?Pain management: pain level controlled ?Vital Signs Assessment: post-procedure vital signs reviewed and stable ?Respiratory status: spontaneous breathing, nonlabored ventilation, respiratory function stable and patient connected to nasal cannula oxygen ?Cardiovascular status: blood pressure returned to baseline and stable ?Postop Assessment: no apparent nausea or vomiting ?Anesthetic complications: no ? ? ?No notable events documented. ? ? ?Last Vitals:  ?Vitals:  ? 04/16/22 0940 04/16/22 0950  ?BP: (!) 144/81 (!) 146/81  ?Pulse: 62 61  ?Resp: 16 17  ?Temp:    ?SpO2: 100% 100%  ?  ?Last Pain:  ?Vitals:  ? 04/16/22 0950  ?TempSrc:   ?PainSc: 0-No pain  ? ? ?  ?  ?  ?  ?  ?  ? ?Martha Clan ? ? ? ? ?

## 2022-04-16 NOTE — Interval H&P Note (Signed)
History and Physical Interval Note: Preprocedure H&P from 04/16/22 ? was reviewed and there was no interval change after seeing and examining the patient.  Written consent was obtained from the patient after discussion of risks, benefits, and alternatives. Patient has consented to proceed with Esophagogastroduodenoscopy and Colonoscopy with possible intervention ? ? ?04/16/2022 ?8:50 AM ? ?Madison Harding  has presented today for surgery, with the diagnosis of Z86.010 - History of colon polyps.  The various methods of treatment have been discussed with the patient and family. After consideration of risks, benefits and other options for treatment, the patient has consented to  Procedure(s): ?COLONOSCOPY WITH PROPOFOL (N/A) as a surgical intervention.  The patient's history has been reviewed, patient examined, no change in status, stable for surgery.  I have reviewed the patient's chart and labs.  Questions were answered to the patient's satisfaction.   ? ? ?Annamaria Helling ? ? ?

## 2022-04-16 NOTE — Anesthesia Preprocedure Evaluation (Signed)
Anesthesia Evaluation  ?Patient identified by MRN, date of birth, ID band ?Patient awake ? ? ? ?Reviewed: ?Allergy & Precautions, NPO status , Patient's Chart, lab work & pertinent test results ? ?History of Anesthesia Complications ?(+) PONV and history of anesthetic complications ? ?Airway ?Mallampati: II ? ?TM Distance: >3 FB ?Neck ROM: Full ? ? ? Dental ? ?(+) Teeth Intact, Caps ?Permanent bridge on bottom left:   ?Pulmonary ?neg shortness of breath, asthma , neg COPD, Recent URI , Resolved,  ?  ? ? ? ? ? ? ? Cardiovascular ?Exercise Tolerance: Good ?hypertension, Pt. on medications ?(-) angina(-) Past MI and (-) Cardiac Stents (-) dysrhythmias (-) Valvular Problems/Murmurs ? ? ?  ?Neuro/Psych ?PSYCHIATRIC DISORDERS Anxiety negative neurological ROS ?   ? GI/Hepatic ?Neg liver ROS, GERD (remote hx)  ,  ?Endo/Other  ?negative endocrine ROS ? Renal/GU ?negative Renal ROS  ? ?  ?Musculoskeletal ? ? Abdominal ?  ?Peds ? Hematology ? ?(+) Blood dyscrasia, anemia ,   ?Anesthesia Other Findings ?Past Medical History: ?No date: Allergic genetic state ?No date: Anemia ?No date: Anxiety ?No date: Asthma ?No date: Bilateral breast cysts ?No date: Cancer [facility]) ?    Comment:  facial skin ?No date: Cataract cortical, senile ?No date: Chicken pox ?No date: Diverticulosis ?No date: GERD (gastroesophageal reflux disease) ?No date: Headache ?01/16/2014: History of adenomatous polyp of colon ?No date: History of bone density study ?No date: History of mammogram ?No date: Hyperlipemia ?No date: Hypertension ?No date: IBS (irritable bowel syndrome) ?No date: IBS (irritable bowel syndrome) ?No date: Migraines ?No date: Osteoarthritis ?    Comment:  hands, feet, hip, lumbar spine ?No date: PONV (postoperative nausea and vomiting) ?No date: Rhinitis ?No date: Rosacea ?No date: Shingles ?No date: Tinea unguium ?No date: Tortuous aorta (HCC) ?No date: Vertigo ? ? Reproductive/Obstetrics ? ?   ? ? ? ? ? ? ? ? ? ? ? ? ? ?  ?  ? ? ? ? ? ? ? ? ?Anesthesia Physical ? ?Anesthesia Plan ? ?ASA: 2 ? ?Anesthesia Plan: General  ? ?Post-op Pain Management:   ? ?Induction: Intravenous ? ?PONV Risk Score and Plan: Propofol infusion and TIVA ? ?Airway Management Planned: Natural Airway ? ?Additional Equipment:  ? ?Intra-op Plan:  ? ?Post-operative Plan:  ? ?Informed Consent: I have reviewed the patients History and Physical, chart, labs and discussed the procedure including the risks, benefits and alternatives for the proposed anesthesia with the patient or authorized representative who has indicated his/her understanding and acceptance.  ? ? ? ? ? ?Plan Discussed with:  ? ?Anesthesia Plan Comments:   ? ? ? ? ? ? ?Anesthesia Quick Evaluation ? ?

## 2022-04-17 ENCOUNTER — Encounter: Payer: Self-pay | Admitting: Gastroenterology

## 2022-04-17 LAB — SURGICAL PATHOLOGY

## 2022-06-10 ENCOUNTER — Emergency Department: Payer: Medicare Other

## 2022-06-10 ENCOUNTER — Encounter: Payer: Self-pay | Admitting: Medical Oncology

## 2022-06-10 ENCOUNTER — Emergency Department
Admission: EM | Admit: 2022-06-10 | Discharge: 2022-06-10 | Disposition: A | Discharge status: Home / Self Care | Payer: Medicare Other | Attending: Emergency Medicine | Admitting: Emergency Medicine

## 2022-06-10 DIAGNOSIS — U071 COVID-19: Secondary | ICD-10-CM | POA: Insufficient documentation

## 2022-06-10 DIAGNOSIS — J45909 Unspecified asthma, uncomplicated: Secondary | ICD-10-CM | POA: Diagnosis not present

## 2022-06-10 DIAGNOSIS — R0600 Dyspnea, unspecified: Secondary | ICD-10-CM | POA: Diagnosis present

## 2022-06-10 LAB — COMPREHENSIVE METABOLIC PANEL
ALT: 27 U/L (ref 0–44)
AST: 31 U/L (ref 15–41)
Albumin: 3.8 g/dL (ref 3.5–5.0)
Alkaline Phosphatase: 67 U/L (ref 38–126)
Anion gap: 8 (ref 5–15)
BUN: 16 mg/dL (ref 8–23)
CO2: 30 mmol/L (ref 22–32)
Calcium: 8.6 mg/dL — ABNORMAL LOW (ref 8.9–10.3)
Chloride: 100 mmol/L (ref 98–111)
Creatinine, Ser: 0.73 mg/dL (ref 0.44–1.00)
GFR, Estimated: >60 mL/min (ref >60)
Glucose, Bld: 93 mg/dL (ref 70–99)
Potassium: 3.6 mmol/L (ref 3.5–5.1)
Sodium: 138 mmol/L (ref 135–145)
Total Bilirubin: 0.9 mg/dL (ref 0.3–1.2)
Total Protein: 7 g/dL (ref 6.5–8.1)

## 2022-06-10 LAB — RESP PANEL BY RT-PCR (FLU A&B, COVID) ARPGX2
Influenza A by PCR: NEGATIVE
Influenza B by PCR: NEGATIVE
SARS Coronavirus 2 by RT PCR: POSITIVE — AB

## 2022-06-10 LAB — CBC WITH DIFFERENTIAL/PLATELET
Abs Immature Granulocytes: 0.02 10*3/uL (ref 0.00–0.07)
Basophils Absolute: 0 10*3/uL (ref 0.0–0.1)
Basophils Relative: 1 %
Eosinophils Absolute: 0 10*3/uL (ref 0.0–0.5)
Eosinophils Relative: 1 %
HCT: 39.6 % (ref 36.0–46.0)
Hemoglobin: 13.3 g/dL (ref 12.0–15.0)
Immature Granulocytes: 1 %
Lymphocytes Relative: 19 %
Lymphs Abs: 0.8 10*3/uL (ref 0.7–4.0)
MCH: 34.8 pg — ABNORMAL HIGH (ref 26.0–34.0)
MCHC: 33.6 g/dL (ref 30.0–36.0)
MCV: 103.7 fL — ABNORMAL HIGH (ref 80.0–100.0)
Monocytes Absolute: 0.8 10*3/uL (ref 0.1–1.0)
Monocytes Relative: 18 %
Neutro Abs: 2.6 10*3/uL (ref 1.7–7.7)
Neutrophils Relative %: 60 %
Platelets: 192 10*3/uL (ref 150–400)
RBC: 3.82 MIL/uL — ABNORMAL LOW (ref 3.87–5.11)
RDW: 12.9 % (ref 11.5–15.5)
WBC: 4.3 10*3/uL (ref 4.0–10.5)
nRBC: 0 % (ref 0.0–0.2)

## 2022-06-10 MED ORDER — NIRMATRELVIR/RITONAVIR (PAXLOVID)TABLET: 3.0000 | ORAL_TABLET | Freq: Two times a day (BID) | ORAL | 0 refills | Status: AC

## 2022-06-10 MED ORDER — DEXAMETHASONE SODIUM PHOSPHATE 10 MG/ML IJ SOLN
10.0000 mg | Freq: Once | INTRAMUSCULAR | Status: AC
Start: 2022-06-10 — End: 2022-06-10
  Administered 2022-06-10: 10 mg via INTRAVENOUS
  Filled 2022-06-10: qty 1

## 2022-06-10 MED ORDER — BENZONATATE 100 MG PO CAPS
200.0000 mg | ORAL_CAPSULE | Freq: Once | ORAL | Status: AC
  Administered 2022-06-10: 200 mg via ORAL
  Filled 2022-06-10: qty 2

## 2022-06-10 MED ORDER — IPRATROPIUM-ALBUTEROL 0.5-2.5 (3) MG/3ML IN SOLN
3.0000 mL | Freq: Once | RESPIRATORY_TRACT | Status: AC
  Administered 2022-06-10: 3 mL via RESPIRATORY_TRACT
  Filled 2022-06-10: qty 3

## 2022-06-10 MED ORDER — METHYLPREDNISOLONE 4 MG PO TBPK: ORAL_TABLET | ORAL | 0 refills | Status: DC

## 2022-06-10 MED ORDER — BENZONATATE 100 MG PO CAPS: 100.0000 mg | ORAL_CAPSULE | Freq: Three times a day (TID) | ORAL | 0 refills | Status: AC | PRN

## 2022-06-10 NOTE — Discharge Instructions (Signed)

## 2022-06-10 NOTE — ED Provider Notes (Signed)
[facility] Provider Note    Event Date/Time   First MD Initiated Contact with Patient 06/10/22 1223     (approximate)   History   Covid Positive   HPI  YARITSA SAVARINO is a 76 y.o. female with history of asthma presents emergency department with complaints of difficulty breathing.  Patient states she had a positive COVID test this morning at home.  Symptoms started yesterday.  No fever or chills.  No chest pain.  Patient states her son is a ID doctor and recommended she go on metformin to prevent long COVID.      Physical Exam   Triage Vital Signs: ED Triage Vitals  Enc Vitals Group     BP 06/10/22 1112 (!) 174/99     Pulse Rate 06/10/22 1112 76     Resp 06/10/22 1112 16     Temp 06/10/22 1112 98.8 F (37.1 C)     Temp Source 06/10/22 1112 Oral     SpO2 06/10/22 1112 95 %     Weight 06/10/22 1117 170 lb (77.1 kg)     Height 06/10/22 1117 '5\' 5"'$  (1.651 m)     Head Circumference --      Peak Flow --      Pain Score 06/10/22 1117 0     Pain Loc --      Pain Edu? --      Excl. in [place]? --     Most recent vital signs: Vitals:   06/10/22 1112  BP: (!) 174/99  Pulse: 76  Resp: 16  Temp: 98.8 F (37.1 C)  SpO2: 95%     General: Awake, no distress.   CV:  Good peripheral perfusion. regular rate and  rhythm Resp:  Normal effort. Lungs with wheezing in the right lung. Abd:  No distention.   Other:      ED Results / Procedures / Treatments   Labs (all labs ordered are listed, but only abnormal results are displayed) Labs Reviewed  RESP PANEL BY RT-PCR (FLU A&B, COVID) ARPGX2 - Abnormal; Notable for the following components:      Result Value   SARS Coronavirus 2 by RT PCR POSITIVE (*)    All other components within normal limits  COMPREHENSIVE METABOLIC PANEL - Abnormal; Notable for the following components:   Calcium 8.6 (*)    All other components within normal limits  CBC WITH DIFFERENTIAL/PLATELET - Abnormal; Notable for the  following components:   RBC 3.82 (*)    MCV 103.7 (*)    MCH 34.8 (*)    All other components within normal limits     EKG     RADIOLOGY Chest x-ray    PROCEDURES:   Procedures   MEDICATIONS ORDERED IN ED: Medications  dexamethasone (DECADRON) injection 10 mg (10 mg Intravenous Given 06/10/22 1442)  benzonatate (TESSALON) capsule 200 mg (200 mg Oral Given 06/10/22 1432)  ipratropium-albuterol (DUONEB) 0.5-2.5 (3) MG/3ML nebulizer solution 3 mL (3 mLs Nebulization Given 06/10/22 1454)     IMPRESSION / MDM / ASSESSMENT AND PLAN / ED COURSE  I reviewed the triage vital signs and the nursing notes.                              Differential diagnosis includes, but is not limited to, CAP, COVID,aspiration  Patient's presentation is most consistent with acute presentation with potential threat to life or bodily function.   Due  to the patient's wheezing will order Decadron, Tessalon for cough, DuoNeb, will repeat COVID and order chest x-ray.  Basic labs ordered also.  COVID test positive.  Labs are reassuring.  Patient be able to take full dosing of Paxlovid.  Patient is to follow-up with her regular doctor if not improving in 3 to 4 days.  Return emergency department worsening.  She was given Paxlovid, Medrol Dosepak, Tessalon Perles.  She does have an inhaler at home.  Return emergency department if worsening.  She is in agreement treatment plan.  Discharged stable condition.   FINAL CLINICAL IMPRESSION(S) / ED DIAGNOSES   Final diagnoses:  COVID-19     Rx / DC Orders   ED Discharge Orders          Ordered    benzonatate (TESSALON PERLES) 100 MG capsule  3 times daily PRN        06/10/22 1556    nirmatrelvir/ritonavir EUA (PAXLOVID) 20 x 150 MG & 10 x '100MG'$  TABS  2 times daily        06/10/22 1556    methylPREDNISolone (MEDROL DOSEPAK) 4 MG TBPK tablet        06/10/22 1556             Note:  This document was prepared using Dragon voice recognition  software and may include unintentional dictation errors.    Versie Starks, PA-C 06/10/22 1559    Lucrezia Starch, MD 06/10/22 [phone]

## 2022-06-10 NOTE — ED Triage Notes (Signed)
Pt reports that she tested positive yesterday for covid and feels bad, wanted to be seen.

## 2023-04-16 ENCOUNTER — Other Ambulatory Visit: Payer: Self-pay | Admitting: Internal Medicine

## 2023-04-16 DIAGNOSIS — R7989 Other specified abnormal findings of blood chemistry: Secondary | ICD-10-CM

## 2023-04-23 ENCOUNTER — Ambulatory Visit
Admission: RE | Admit: 2023-04-23 | Discharge: 2023-04-23 | Disposition: A | Discharge status: Home / Self Care | Payer: Medicare Other | Source: Ambulatory Visit | Attending: Internal Medicine | Admitting: Internal Medicine

## 2023-04-23 DIAGNOSIS — R7989 Other specified abnormal findings of blood chemistry: Secondary | ICD-10-CM

## 2023-05-21 DIAGNOSIS — Z96641 Presence of right artificial hip joint: Secondary | ICD-10-CM | POA: Insufficient documentation

## 2023-10-18 IMAGING — CR DG CHEST 2V
2 series · 2 of 2 positions shown · non-contrast
Comparison: Chest CT dated August 10, 2010

CLINICAL DATA: Shortness of breath

EXAM:
CHEST - 2 VIEW

[chest pa]
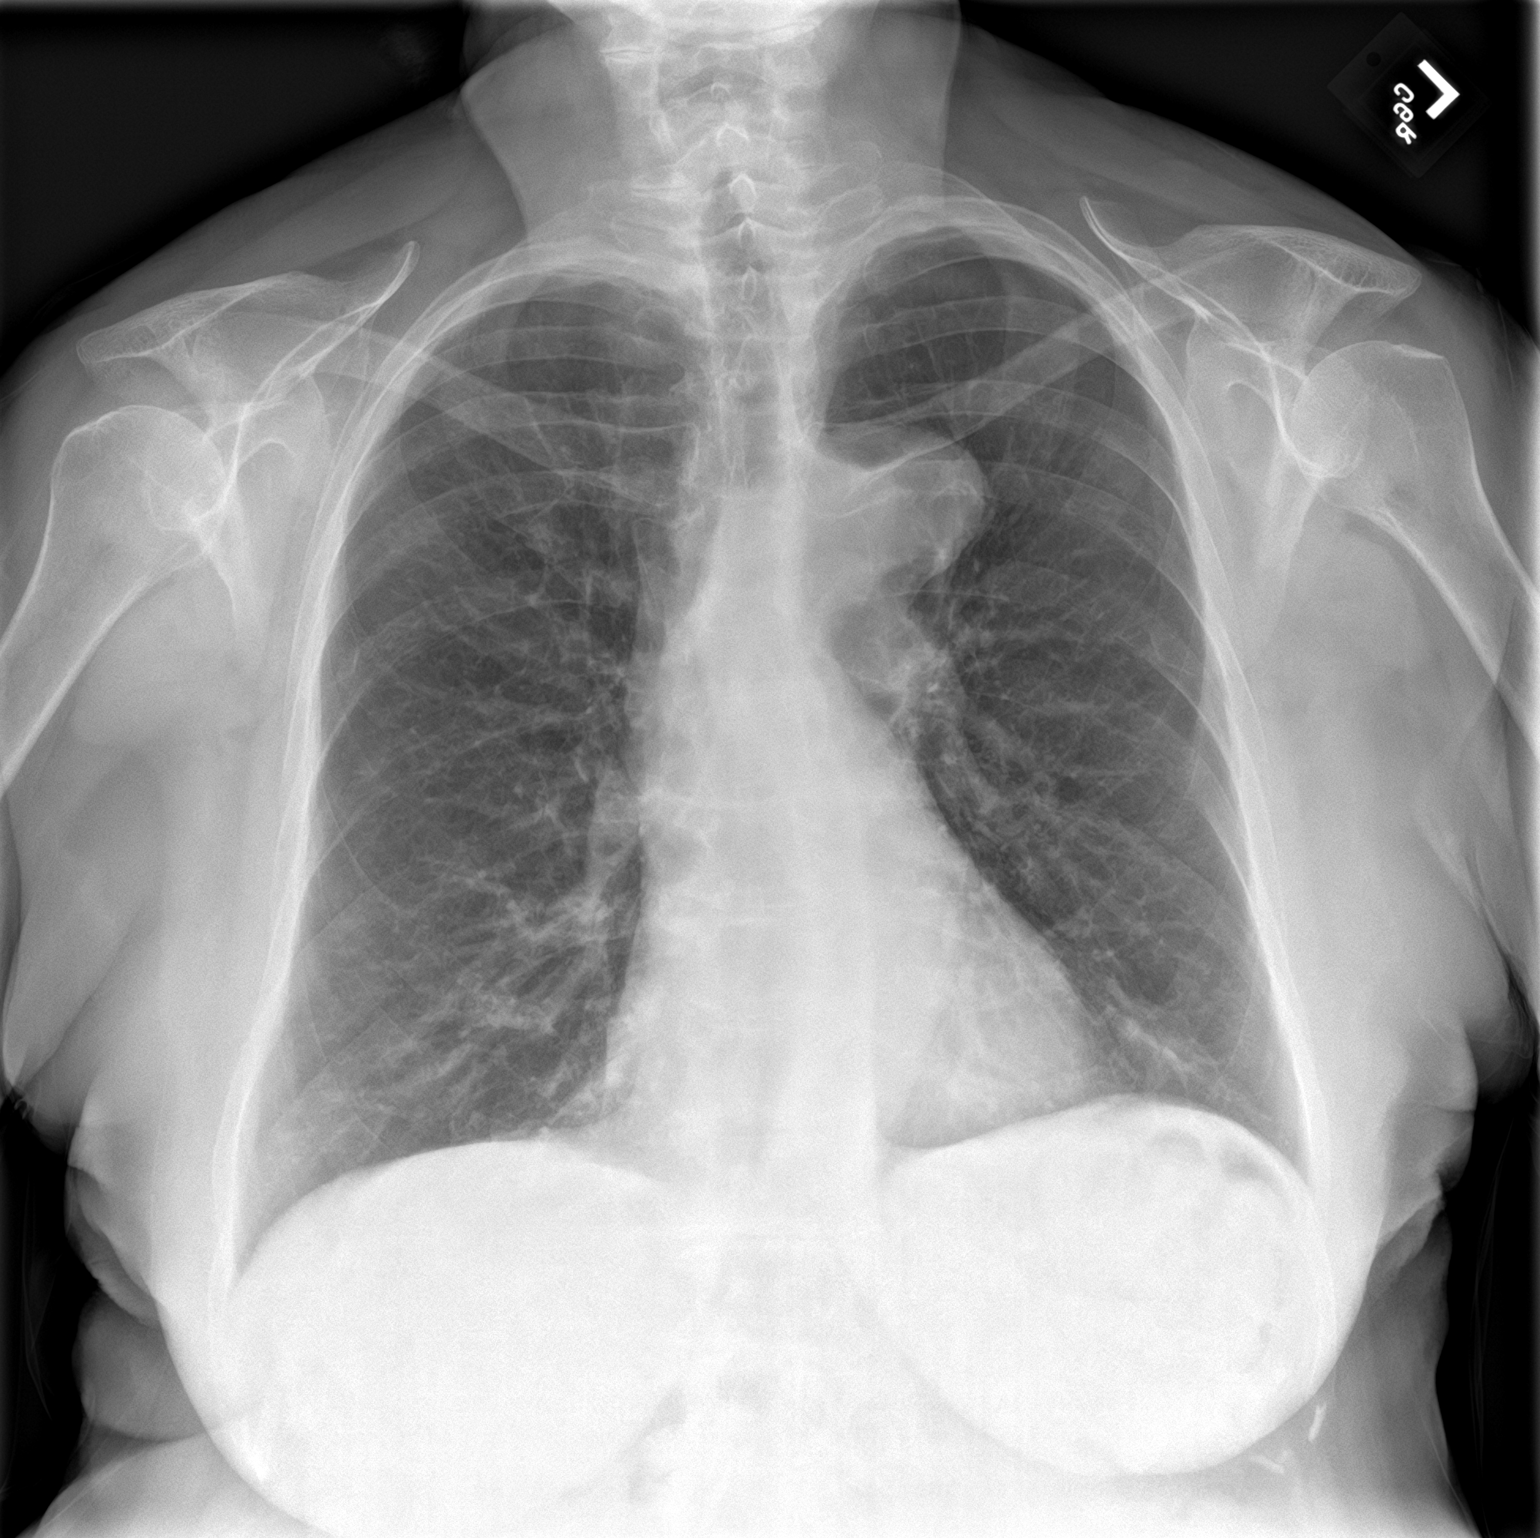

[chest lat]
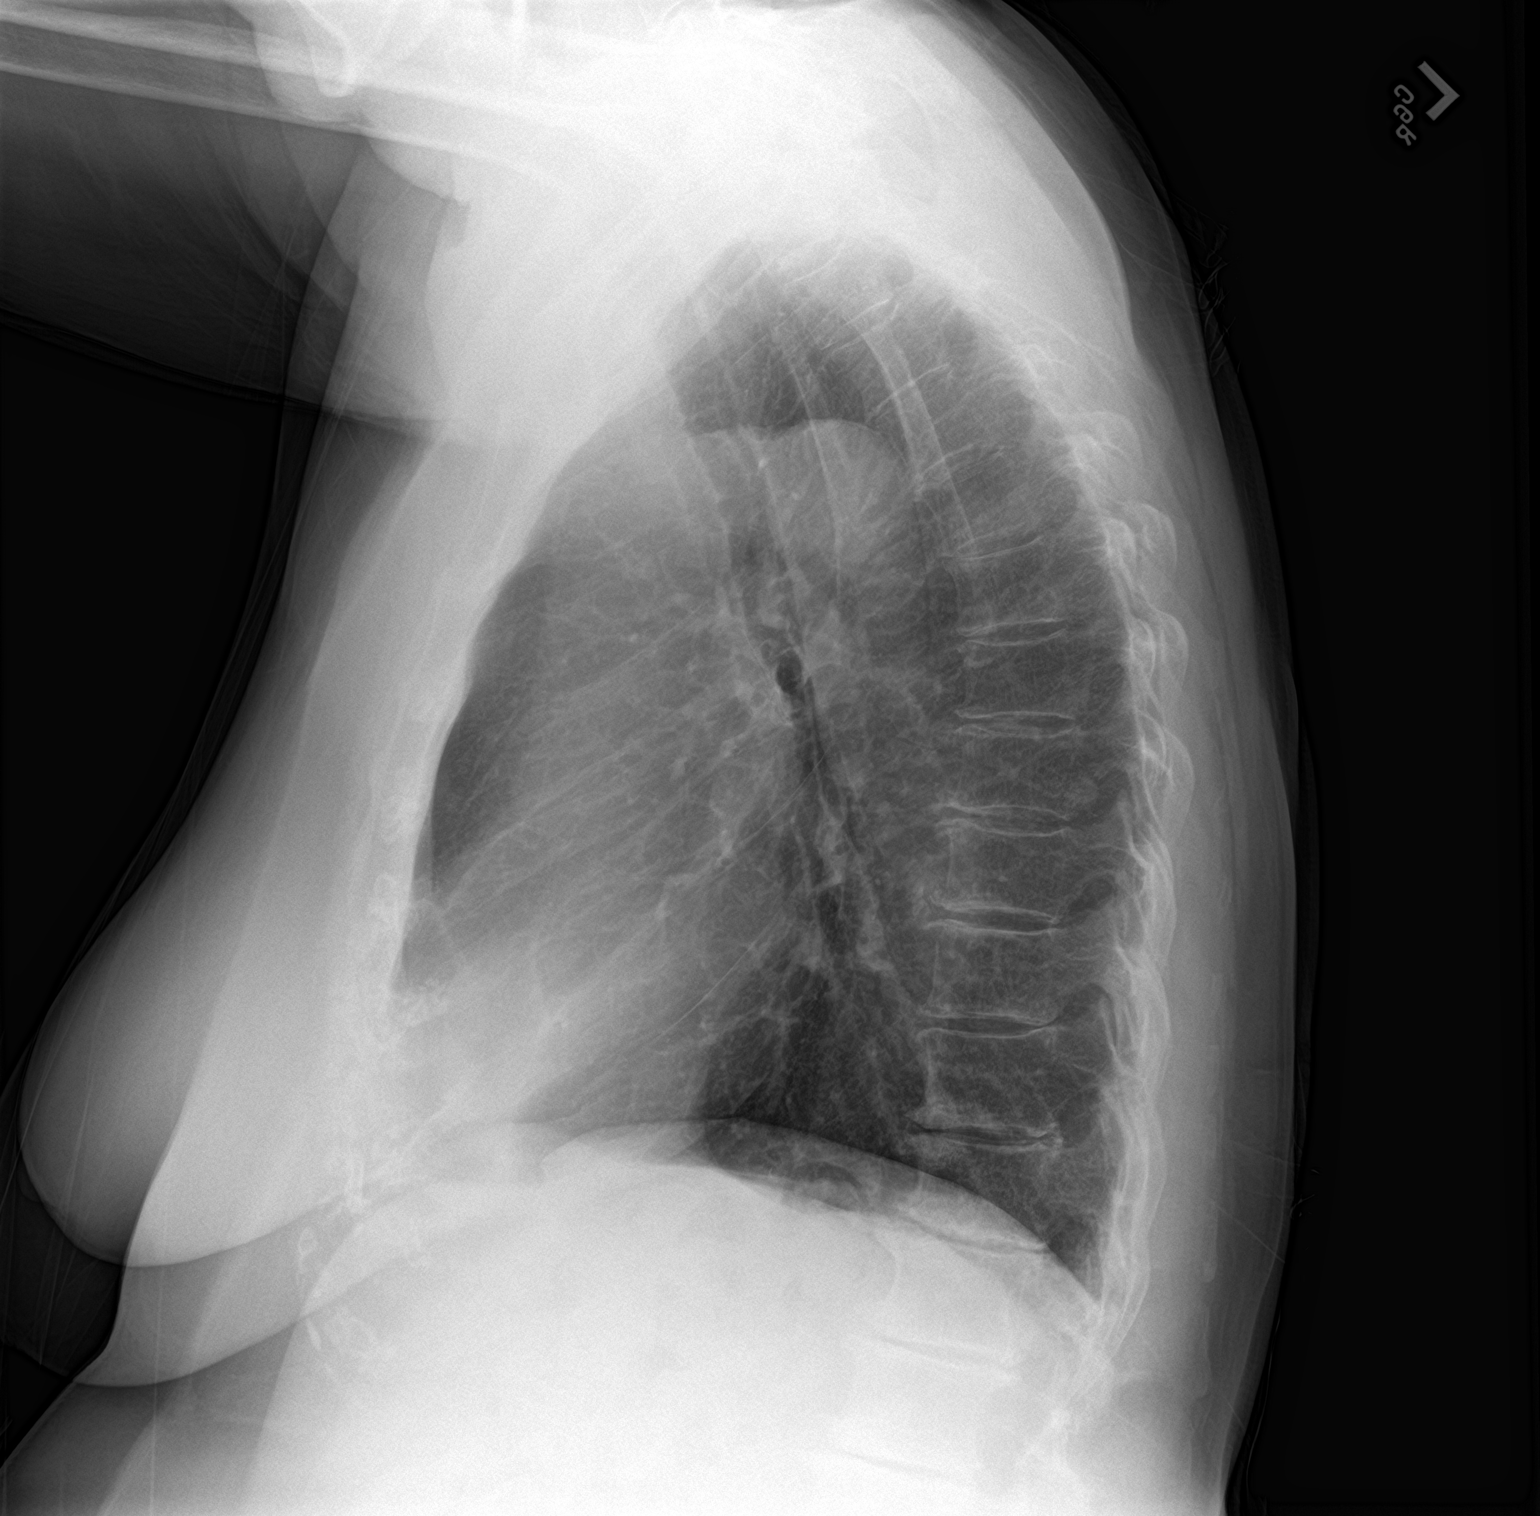

[2 of 2 positions shown; findings below may reference images not displayed]

FINDINGS: Cardiac contours within normal limits. Widening of the upper
mediastinum is favored to be due to marked tortuosity of the aortic
arch when compared with prior CT. Lungs are clear. No pleural
effusion or pneumothorax.
IMPRESSION: 1. No active cardiopulmonary disease.
2. Widening of the upper mediastinum is favored to be due to marked
tortuosity of the aortic arch when compared with prior CT.

## 2023-10-22 ENCOUNTER — Other Ambulatory Visit: Payer: Self-pay | Admitting: Internal Medicine

## 2023-10-22 DIAGNOSIS — Z1231 Encounter for screening mammogram for malignant neoplasm of breast: Secondary | ICD-10-CM

## 2023-10-29 ENCOUNTER — Ambulatory Visit
Admission: RE | Admit: 2023-10-29 | Discharge: 2023-10-29 | Disposition: A | Discharge status: Home / Self Care | Payer: Medicare Other | Source: Ambulatory Visit | Attending: Internal Medicine | Admitting: Internal Medicine

## 2023-10-29 DIAGNOSIS — Z1231 Encounter for screening mammogram for malignant neoplasm of breast: Secondary | ICD-10-CM | POA: Insufficient documentation

## 2024-04-24 NOTE — Progress Notes (Signed)
 Madison Harding is a 78 y.o. female here for follow up of their medical problems  CHIEF COMPLAINT:  Follow up medical problems in the problem list and as discussed in the history and assessment areas as well as new complaints as listed.   Patient Active Problem List  Diagnosis  . HTN (hypertension), benign  . Hyperlipidemia  . OA (osteoarthritis)  . Osteopenia  . Asthma (HHS-HCC)  . Health care maintenance  . 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 Anxiety, generalized  . 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    HTN (hypertension), benign Taking medications without noted side effects or dizziness.    Hyperlipidemia Healthy fat diet is being followed and no myalgia's are noted.   Past Medical History:  Diagnosis Date  . Allergic state   . Anemia   . Anxiety   . Asthma without status asthmaticus (HHS-HCC)   . Bilateral breast cysts   . Cancer (CMS/HHS-HCC)    facial skin cancer  . Cataract cortical, senile   . Chicken pox   . Diverticulosis   . GERD (gastroesophageal reflux disease)   . H/O adenomatous polyp of colon 02/13/2015  . H/O mammogram 01/16/2014  . History of bone density study 03/15/2012  . History of COVID-19 06/10/2022  . Hyperlipidemia   . Hypertension   . IBS (irritable bowel syndrome)   . Migraines   . Motion sickness   . Osteoarthritis    hands, feet, hip, lumbar spine  . Osteopenia 07/14/2014  . PONV (postoperative nausea and vomiting)   . Rhinitis   . Rosacea   . Shingles   . Tinea unguium   . Tortuous aorta ()   . Ulcer   . Vertigo     Past Surgical History:  Procedure Laterality Date  . Nasal surgery  2000   removal of skin cancer  . HYSTERECTOMY  2002   with BSO  . EGD  02/16/2003  . COLONOSCOPY  11/02/2003   FH Colon Polyps (Brother)  .  COLONOSCOPY  11/01/2012   10mm Adenomatous Polyp, FH Colon Polyps (Brother): CBF 02/2015; recall ltr mailed 01/04/2015 (dw)  . FRACTURE SURGERY Left 10/2013   wrist  . ARTHROPLASTY HIP TOTAL Left 08/28/2014   Procedure: ARTHROPLASTY HIP TOTAL;  Surgeon: Glendia Trenda Lawrence, MD;  Location: [facility] OR;  Service: Orthopedics;  Laterality: Left;  . COLONOSCOPY  05/31/2015   Adenomatous Polyps, FH Colon Polyps (Brother): CBF 05/2018; Recall ltr mailed 04/06/2018 (kj)  . COLONOSCOPY  09/01/2018   Adenomatous Polyp; FHCP (Brother) CBF 08/2021  . COLONOSCOPY  04/16/2022   Tubular adenoma/PHx CP/FHx CP/Repeat 37yrs/SMR  . ARTHROPLASTY HIP TOTAL Right 05/11/2023   Procedure: PRIMARY TOTAL HIP ARTHROPLASTY;  Surgeon: Aimee Juliene Dunnings, MD;  Location: [facility] OR;  Service: Orthopedics;  Laterality: Right;  . ASPIRATION CYST BREAST    . JOINT REPLACEMENT  hip  . Left wrist plate post fracture    . Right arthroscopy    . Right shoulder surgery       No fever chills or sweats   No nausea, vomiting or diarrhea  No chest pain, shortness of breath   Social History   Socioeconomic History  . Marital status: Married  Tobacco Use  . Smoking status: Never  . Smokeless tobacco: Never  Vaping Use  . Vaping  status: Never Used  Substance and Sexual Activity  . Alcohol use: Yes    Alcohol/week: 1.0 standard drink of alcohol    Types: 1 Shots of liquor per week  . Drug use: No  . Sexual activity: Not Currently    Partners: Male    Birth control/protection: None   Social Drivers of Health   Financial Resource Strain: Low Risk  (10/17/2023)   Overall Financial Resource Strain (CARDIA)   . Difficulty of Paying Living Expenses: Not hard at all  Food Insecurity: No Food Insecurity (10/17/2023)   Hunger Vital Sign   . Worried About Programme researcher, broadcasting/film/video in the Last Year: Never true   . Ran Out of Food in the Last Year: Never true  Transportation Needs: No Transportation Needs (10/17/2023)   PRAPARE -  Transportation   . Lack of Transportation (Medical): No   . Lack of Transportation (Non-Medical): No  Housing Stability: Low Risk  (04/17/2024)   Housing Stability Vital Sign   . Unable to Pay for Housing in the Last Year: No   . Number of Times Moved in the Last Year: 0   . Homeless in the Last Year: No      Current Outpatient Medications:  .  albuterol  90 mcg/actuation inhaler, 2 puffs q 6 hours prn (Patient taking differently: Inhale 2 inhalations into the lungs every 4 (four) hours as needed for Wheezing or Shortness of Breath 2 puffs q 6 hours prn), Disp: 18 g, Rfl: 2 .  amLODIPine (NORVASC) 5 MG tablet, TAKE 1 TABLET BY MOUTH EVERY DAY, Disp: 90 tablet, Rfl: 3 .  amoxicillin (AMOXIL) 500 MG tablet, Take 4 tabs (2mg ) by mouth 1 hour before dental procedure, Disp: 8 tablet, Rfl: 2 .  aspirin 81 MG EC tablet, Take 81 mg by mouth once daily, Disp: , Rfl:  .  cholecalciferol (VITAMIN D3) 1000 unit tablet, Take 1,000 Units by mouth once daily. , Disp: , Rfl:  .  escitalopram oxalate (LEXAPRO) 10 MG tablet, Take 1 tablet (10 mg total) by mouth at bedtime, Disp: 90 tablet, Rfl: 1 .  fluticasone (FLONASE) 50 mcg/actuation nasal spray, Place 2 sprays into both nostrils once daily, Disp: 48 g, Rfl: 3 .  Herbal Supplement, Take 1 capsule by mouth once daily Herbal Name: Pure Encapsulations 5,000 units, Disp: , Rfl:  .  hydroCHLOROthiazide (HYDRODIURIL) 25 MG tablet, TAKE 1 TABLET BY MOUTH EVERY DAY IN THE MORNING, Disp: 90 tablet, Rfl: 1 .  inhalational spacer (AEROCHAMBER) spacer, Use as instructed., Disp: 1 each, Rfl: 2 .  montelukast (SINGULAIR) 10 mg tablet, Take 1 tablet (10 mg total) by mouth at bedtime, Disp: 100 tablet, Rfl: 3 .  omega-3-dha-epa-fish oil (FISH OIL) 300-1,000 mg capsule, Take 2 capsules by mouth once daily, Disp: , Rfl:  .  omeprazole (PRILOSEC OTC) 20 MG EC tablet, Take 20 mg by mouth once daily, Disp: , Rfl:  .  VIT A/VIT C/VIT E/ZINC/COPPER (PRESERVISION AREDS ORAL), Take  1 tablet by mouth 2 (two) times daily. , Disp: , Rfl:  .  acetaminophen  (TYLENOL ) 500 mg capsule, Take 2 capsules (1,000 mg total) by mouth every 8 (eight) hours as needed for Pain, Disp: , Rfl:  .  aspirin 81 MG EC tablet, Take 1 tablet (81 mg total) by mouth every 12 (twelve) hours for 42 days, Disp: 84 tablet, Rfl: 0  Vitals:   04/24/24 1352  BP: 137/71  Pulse: 62   Body mass index is 26.92 kg/m. No acute  distress Lungs; clear to ascultation Heart; Regular rate and rhythm  Abdomen; Soft and flat, normal bowel sounds Extremities; No clubbing, cyanosis or edema  Appointment on 04/17/2024  Component Date Value Ref Range Status  . Glucose 04/17/2024 101  70 - 110 mg/dL Final  . Sodium 95/78/7974 140  136 - 145 mmol/L Final  . Potassium 04/17/2024 3.9  3.6 - 5.1 mmol/L Final  . Chloride 04/17/2024 100  97 - 109 mmol/L Final  . Carbon Dioxide (CO2) 04/17/2024 32.8 (H)  22.0 - 32.0 mmol/L Final  . Urea Nitrogen (BUN) 04/17/2024 19  7 - 25 mg/dL Final  . Creatinine 95/78/7974 0.9  0.6 - 1.1 mg/dL Final  . Glomerular Filtration Rate (eGFR) 04/17/2024 66  >60 mL/min/1.73sq m Final  . Calcium 04/17/2024 9.2  8.7 - 10.3 mg/dL Final  . AST  95/78/7974 20  8 - 39 U/L Final  . ALT  04/17/2024 17  5 - 38 U/L Final  . Alk Phos (alkaline Phosphatase) 04/17/2024 73  34 - 104 U/L Final  . Albumin 04/17/2024 4.0  3.5 - 4.8 g/dL Final  . Bilirubin, Total 04/17/2024 1.0  0.3 - 1.2 mg/dL Final  . Protein, Total 04/17/2024 6.4  6.1 - 7.9 g/dL Final  . A/G Ratio 95/78/7974 1.7  1.0 - 5.0 gm/dL Final  . Hemoglobin J8R 04/17/2024 5.4  4.2 - 5.6 % Final  . Average Blood Glucose (Calc) 04/17/2024 108  mg/dL Final  . Cholesterol, Total 04/17/2024 243 (H)  100 - 200 mg/dL Final  . Triglyceride 95/78/7974 118  35 - 199 mg/dL Final  . HDL (High Density Lipoprotein) Cho* 04/17/2024 103.2 (H)  35.0 - 85.0 mg/dL Final  . LDL Calculated 04/17/2024 883  0 - 130 mg/dL Final  . VLDL Cholesterol 04/17/2024 24   mg/dL Final  . Cholesterol/HDL Ratio 04/17/2024 2.4   Final    ASSESSMENT  AND PLAN:  Diagnoses and all orders for this visit:  HTN (hypertension), benign Assessment & Plan: Taking medications without noted side effects or dizziness.    Orders: -     Comprehensive Metabolic Panel (CMP); Future -     Hemoglobin A1C; Future -     Lipid Panel w/calc LDL; Future  Pure hypercholesterolemia Assessment & Plan: Healthy fat diet is being followed and no myalgia's are noted.   Orders: -     Comprehensive Metabolic Panel (CMP); Future -     Hemoglobin A1C; Future -     Lipid Panel w/calc LDL; Future  Mild intermittent asthma without complication (HHS-HCC) Assessment & Plan: Rescue inhaler use is not escalating and no increase in flairs has been noted.     Anxiety, generalized Assessment & Plan: Stable on lexapro   Abnormal finding of blood chemistry, unspecified -     Hemoglobin A1C; Future

## 2024-07-13 ENCOUNTER — Emergency Department
Admission: EM | Admit: 2024-07-13 | Discharge: 2024-07-13 | Disposition: A | Discharge status: Home / Self Care | Attending: Emergency Medicine | Admitting: Emergency Medicine

## 2024-07-13 ENCOUNTER — Emergency Department

## 2024-07-13 ENCOUNTER — Other Ambulatory Visit: Payer: Self-pay

## 2024-07-13 ENCOUNTER — Telehealth: Payer: Self-pay | Admitting: Neurosurgery

## 2024-07-13 DIAGNOSIS — S0101XA Laceration without foreign body of scalp, initial encounter: Secondary | ICD-10-CM | POA: Diagnosis not present

## 2024-07-13 DIAGNOSIS — S065XAA Traumatic subdural hemorrhage with loss of consciousness status unknown, initial encounter: Secondary | ICD-10-CM

## 2024-07-13 DIAGNOSIS — S065X0A Traumatic subdural hemorrhage without loss of consciousness, initial encounter: Secondary | ICD-10-CM | POA: Insufficient documentation

## 2024-07-13 DIAGNOSIS — W01198A Fall on same level from slipping, tripping and stumbling with subsequent striking against other object, initial encounter: Secondary | ICD-10-CM | POA: Insufficient documentation

## 2024-07-13 DIAGNOSIS — W19XXXA Unspecified fall, initial encounter: Secondary | ICD-10-CM

## 2024-07-13 DIAGNOSIS — S0990XA Unspecified injury of head, initial encounter: Secondary | ICD-10-CM | POA: Diagnosis present

## 2024-07-13 DIAGNOSIS — I10 Essential (primary) hypertension: Secondary | ICD-10-CM | POA: Diagnosis not present

## 2024-07-13 LAB — COMPREHENSIVE METABOLIC PANEL WITH GFR
ALT: 22 U/L (ref 0–44)
AST: 30 U/L (ref 15–41)
Albumin: 3.5 g/dL (ref 3.5–5.0)
Alkaline Phosphatase: 85 U/L (ref 38–126)
Anion gap: 12 (ref 5–15)
BUN: 17 mg/dL (ref 8–23)
CO2: 24 mmol/L (ref 22–32)
Calcium: 8.7 mg/dL — ABNORMAL LOW (ref 8.9–10.3)
Chloride: 103 mmol/L (ref 98–111)
Creatinine, Ser: 0.69 mg/dL (ref 0.44–1.00)
GFR, Estimated: >60 mL/min (ref >60)
Glucose, Bld: 111 mg/dL — ABNORMAL HIGH (ref 70–99)
Potassium: 3.2 mmol/L — ABNORMAL LOW (ref 3.5–5.1)
Sodium: 139 mmol/L (ref 135–145)
Total Bilirubin: 0.6 mg/dL (ref 0.0–1.2)
Total Protein: 6.8 g/dL (ref 6.5–8.1)

## 2024-07-13 LAB — CBC
HCT: 36.3 % (ref 36.0–46.0)
Hemoglobin: 12.7 g/dL (ref 12.0–15.0)
MCH: 34.9 pg — ABNORMAL HIGH (ref 26.0–34.0)
MCHC: 35 g/dL (ref 30.0–36.0)
MCV: 99.7 fL (ref 80.0–100.0)
Platelets: 202 K/uL (ref 150–400)
RBC: 3.64 MIL/uL — ABNORMAL LOW (ref 3.87–5.11)
RDW: 13.3 % (ref 11.5–15.5)
WBC: 7.5 K/uL (ref 4.0–10.5)
nRBC: 0 % (ref 0.0–0.2)

## 2024-07-13 LAB — URINALYSIS, ROUTINE W REFLEX MICROSCOPIC
Bilirubin Urine: NEGATIVE
Glucose, UA: NEGATIVE mg/dL
Hgb urine dipstick: NEGATIVE
Ketones, ur: NEGATIVE mg/dL
Leukocytes,Ua: NEGATIVE
Nitrite: NEGATIVE
Protein, ur: NEGATIVE mg/dL
Specific Gravity, Urine: 1.008 (ref 1.005–1.030)
pH: 5 (ref 5.0–8.0)

## 2024-07-13 LAB — CBG MONITORING, ED: Glucose-Capillary: 133 mg/dL — ABNORMAL HIGH (ref 70–99)

## 2024-07-13 LAB — MAGNESIUM: Magnesium: 2 mg/dL (ref 1.7–2.4)

## 2024-07-13 LAB — TROPONIN I (HIGH SENSITIVITY)
Troponin I (High Sensitivity): 6 ng/L (ref <18)
Troponin I (High Sensitivity): 6 ng/L (ref <18)

## 2024-07-13 MED ORDER — ONDANSETRON HCL 4 MG/2ML IJ SOLN
4.0000 mg | Freq: Once | INTRAMUSCULAR | Status: AC
  Administered 2024-07-13: 4 mg via INTRAVENOUS
  Filled 2024-07-13: qty 2

## 2024-07-13 MED ORDER — ACETAMINOPHEN 325 MG PO TABS
650.0000 mg | ORAL_TABLET | Freq: Once | ORAL | Status: AC
  Administered 2024-07-13: 650 mg via ORAL
  Filled 2024-07-13: qty 2

## 2024-07-13 NOTE — Telephone Encounter (Signed)
 Claudene Penne ORN, MD  [facility] Admin Clinic: [facility] Timeline: 2-4 wks Tests to order: ct head non con

## 2024-07-13 NOTE — ED Provider Notes (Signed)
.-----------------------------------------   7:17 AM on 07/13/2024 -----------------------------------------  Blood pressure 104/69, pulse 61, temperature 98.7 F (37.1 C), temperature source Oral, resp. rate 20, SpO2 96%.  Assuming care from Dr. Cyrena.  In short, Madison Harding is a 78 y.o. female with a chief complaint of Loss of Consciousness .  Refer to the original H&P for additional details.  The current plan of care is to follow up repeat CT and neurosurgery recs.  Independent review of CT, subdural is stable, on reassessment patient is feeling a lot better, ambulated to the bathroom.  Initially had some nausea and was given Zofran .  Feels better now.  Discussed with patient and husband about CT imaging results as well as discussion with neurosurgery.  They are agreeable plan for discharge and outpatient follow-up.  Dr. Cyrena had also put instructions to stop your hydrochlorothiazide and to take the losartan and follow-up with your primary care doctor to get your medication list reviewed.  Considered but no indication for inpatient admission at this time, she safe for outpatient management.  Will discharge with strict precautions.  Shared decision making done with patient and husband and they are agreeable with this plan.  Clinical Course as of 07/13/24 0941  Thu Jul 13, 2024  0821 CT Head Wo Contrast IMPRESSION: 1. Stable Subdural Blood along the tentorium. No intracranial mass effect. 2. No other acute intracranial abnormality. 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 3. Large left scalp hematoma. No skull fracture identified.   [TT]  [id number] Spoke to Dr. Claudene from neurosurgery, CT stable, able to follow-up with them outpatient.  He will send the clinic a message.  Stable for discharge. [TT]    Clinical Course User Index [TT] Waymond Lorelle Cummins, MD      Waymond Lorelle Cummins, MD 07/13/24 [phone]

## 2024-07-13 NOTE — Telephone Encounter (Signed)
 CT order placed-need to call and schedule follow up appointment in 4 weeks

## 2024-07-13 NOTE — Progress Notes (Signed)
 I reviewed her follow-up scans which were stable.  Small tentorial subdural hematoma without any evidence of compression.  This appears stable on repeat scans.  Will plan on seeing her in our clinic with repeat head CT in a 2 to 4 weeks.

## 2024-07-13 NOTE — ED Triage Notes (Signed)
 Pt presents via EMS from home c/o possible syncopal episode. EMS reports +LOC, pt confused upon awakening. Doesn't remember falling, only remembers going to sleep and waking up in the ambulance. A&O x4 at this time. EMS reports laceration to back of head, bleeding controlled on arrival per EMS. C-collar in place.

## 2024-07-13 NOTE — ED Provider Notes (Addendum)
 [facility] Provider Note    Event Date/Time   First MD Initiated Contact with Patient 07/13/24 0037     (approximate)   History   Loss of Consciousness   HPI  Madison Harding is a 78 y.o. female   Past medical history of hypertension and hyperlipidemia presents Emergency Department with a fall with head strike and laceration to back of head.  Remembers going to sleep feeling well and woke up to get some water in the kitchen and the next thing she knows she was in ambulance.  Her husband sleeps in a different room and hurt her fall in the kitchen came out to see her on the ground with a laceration of the back of the head.  She had repetitive questioning.  She does not take blood thinner.  She reports no other pain elsewhere aside from the back of her head.  She cannot recall exactly what made her fall but does note that she has been feeling lightheaded upon standing after her blood pressure medications were adjusted about a week ago.  Independent Historian contributed to assessment above: Husband at bedside corroborates with patient given     Physical Exam   Triage Vital Signs: ED Triage Vitals  Encounter Vitals Group     BP 07/13/24 0052 (!) 150/81     Girls Systolic BP Percentile --      Girls Diastolic BP Percentile --      Boys Systolic BP Percentile --      Boys Diastolic BP Percentile --      Pulse Rate 07/13/24 0026 65     Resp 07/13/24 0026 18     Temp 07/13/24 0112 97.6 F (36.4 C)     Temp Source 07/13/24 0112 Oral     SpO2 07/13/24 0026 92 %     Weight --      Height --      Head Circumference --      Peak Flow --      Pain Score 07/13/24 0026 0     Pain Loc --      Pain Education --      Exclude from Growth Chart --     Most recent vital signs: Vitals:   07/13/24 0052 07/13/24 0112  BP: (!) 150/81 (!) 167/74  Pulse:  66  Resp:  19  Temp:  97.6 F (36.4 C)  SpO2: 96% 99%    General: Awake, no distress.  CV:  Good  peripheral perfusion.  Resp:  Normal effort.  Abd:  No distention.  Other:  Small 1 cm nongaping laceration to the back of the head, no bogginess, no CT or L-spine tenderness thoracic injury or tenderness, soft nontender abdomen, and moving all extremities at all joints with full active range of motion.   ED Results / Procedures / Treatments   Labs (all labs ordered are listed, but only abnormal results are displayed) Labs Reviewed  COMPREHENSIVE METABOLIC PANEL WITH GFR - Abnormal; Notable for the following components:      Result Value   Potassium 3.2 (*)    Glucose, Bld 111 (*)    Calcium 8.7 (*)    All other components within normal limits  CBC - Abnormal; Notable for the following components:   RBC 3.64 (*)    MCH 34.9 (*)    All other components within normal limits  URINALYSIS, ROUTINE W REFLEX MICROSCOPIC - Abnormal; Notable for the following components:   Color, Urine  STRAW (*)    APPearance CLEAR (*)    All other components within normal limits  CBG MONITORING, ED - Abnormal; Notable for the following components:   Glucose-Capillary 133 (*)    All other components within normal limits  MAGNESIUM  TROPONIN I (HIGH SENSITIVITY)  TROPONIN I (HIGH SENSITIVITY)     I ordered and reviewed the above labs they are notable for troponin negative.  EKG  ED ECG REPORT I, Ginnie Shams, the attending physician, personally viewed and interpreted this ECG.   Date: 07/13/2024  EKG Time: 0021  Rate: 64  Rhythm: sinus  Axis: nl  Intervals:nl  ST&T Change: no stemi    RADIOLOGY I independently reviewed and interpreted CT of the head today no obvious bleeding or midline shift I also reviewed radiologist's formal read.   PROCEDURES:  Critical Care performed: Yes, see critical care procedure note(s)  .Critical Care  Performed by: Shams Ginnie, MD Authorized by: Shams Ginnie, MD   Critical care provider statement:    Critical care time (minutes):  50   Critical care was  time spent personally by me on the following activities:  Development of treatment plan with patient or surrogate, discussions with consultants, evaluation of patient's response to treatment, examination of patient, ordering and review of laboratory studies, ordering and review of radiographic studies, ordering and performing treatments and interventions, pulse oximetry, re-evaluation of patient's condition and review of old charts .Laceration Repair  Date/Time: 07/13/2024 2:47 AM  Performed by: Shams Ginnie, MD Authorized by: Shams Ginnie, MD   Consent:    Consent obtained:  Verbal   Consent given by:  Patient   Risks discussed:  Infection and poor wound healing   Alternatives discussed:  No treatment and delayed treatment Universal protocol:    Procedure explained and questions answered to patient or proxy's satisfaction: yes     Patient identity confirmed:  Verbally with patient Laceration details:    Location:  Scalp   Scalp location:  Occipital   Length (cm):  1 Exploration:    Limited defect created (wound extended): no     Hemostasis achieved with:  Direct pressure   Wound extent: no foreign body   Treatment:    Area cleansed with:  Soap and water Skin repair:    Repair method:  Tissue adhesive Approximation:    Approximation:  Close Repair type:    Repair type:  Simple Post-procedure details:    Dressing:  Open (no dressing)    MEDICATIONS ORDERED IN ED: Medications - No data to display  External physician / consultants:  I spoke with Dr. Reeves Nine of neurosurgery regarding care plan for this patient.   IMPRESSION / MDM / ASSESSMENT AND PLAN / ED COURSE  I reviewed the triage vital signs and the nursing notes.                                Patient's presentation is most consistent with acute presentation with potential threat to life or bodily function.  Differential diagnosis includes, but is not limited to, orthostatic hypotension leading to blunt  traumatic injury head injury laceration ICH skull fracture C-spine injury, considered but less likely hydration electrolyte derangement cardiac syncope arrhythmia ACS   The patient is on the cardiac monitor to evaluate for evidence of arrhythmia and/or significant heart rate changes.  MDM:    Fall with laceration repaired as above.  Small subdural with no midline  shift discussed with Dr. Katrina of neurosurgery plan for repeat CT in 6 hours.  Neuro intact and mentation sharp aside from amnesia surrounding her event.  Most likely a syncopal event given her blood pressure medication adjustments and lightheadedness since then.  Blood pressure okay right now.  Check labs including troponins EKG which all look normal so far.  Holding Emergency Department till repeat scan obtained around 7 AM and discussed with neurosurgery for disposition.        FINAL CLINICAL IMPRESSION(S) / ED DIAGNOSES   Final diagnoses:  Fall, initial encounter  Scalp laceration, initial encounter  Subdural hematoma (HCC)     Rx / DC Orders   ED Discharge Orders     None        Note:  This document was prepared using Dragon voice recognition software and may include unintentional dictation errors.    Cyrena Mylar, MD 07/13/24 9750    Cyrena Mylar, MD 07/13/24 [phone]

## 2024-07-13 NOTE — ED Notes (Signed)
 This tech assisted pt to the bathroom. Pt urinated in toilet. Pt was assisted back into bed with call bell in reach.

## 2024-07-13 NOTE — ED Notes (Signed)
 Pt back from CT

## 2024-07-13 NOTE — Discharge Instructions (Addendum)
 Perhaps your fall was due to the change in your blood pressure medications.  Instead of taking the losartan with hydrochlorothiazide, just take the losartan as a single agent and discontinue your hydrochlorothiazide.  Please call your primary doctor to inform them of this suggested change-I will defer to them for any other medication changes but they wish to do at this time.  Please also make sure to call neurosurgery to see when your follow-up appointment is scheduled.

## 2024-07-18 NOTE — Telephone Encounter (Signed)
 Patient scheduled for 08/10/2024.

## 2024-08-03 ENCOUNTER — Ambulatory Visit
Admission: RE | Admit: 2024-08-03 | Discharge: 2024-08-03 | Disposition: A | Discharge status: Home / Self Care | Source: Ambulatory Visit | Attending: Neurosurgery | Admitting: Neurosurgery

## 2024-08-03 DIAGNOSIS — S065XAA Traumatic subdural hemorrhage with loss of consciousness status unknown, initial encounter: Secondary | ICD-10-CM | POA: Diagnosis present

## 2024-08-09 ENCOUNTER — Ambulatory Visit: Admitting: Physician Assistant

## 2024-08-09 NOTE — Progress Notes (Unsigned)
 Referring Physician:  Lenon Layman ORN, MD [address] [facility] [place],  [place] [address]  Primary Physician:  Lenon Layman ORN, MD  History of Present Illness: 08/15/2024 [name] is here today after suffering a fall on 07/13/2024.  She was found to have a subdural hemorrhage.  Overall, she is doing well.  She continues to have some postconcussive symptoms including dizziness which may be related to her blood pressure control.  Otherwise is doing well.  She denies any headaches at this time.  Denies any new falls.   The symptoms are causing a significant impact on the patient's life.   Review of Systems:  A 10 point review of systems is negative, except for the pertinent positives and negatives detailed in the HPI.  Past Medical History: Past Medical History:  Diagnosis Date   Allergic genetic state    Anemia    Anxiety    Asthma    Bilateral breast cysts    Cancer (HCC)    facial skin   Cataract cortical, senile    Chicken pox    Diverticulosis    GERD (gastroesophageal reflux disease)    Headache    History of adenomatous polyp of colon 01/16/2014   History of bone density study    History of mammogram    Hyperlipemia    Hypertension    IBS (irritable bowel syndrome)    IBS (irritable bowel syndrome)    Migraines    Osteoarthritis    hands, feet, hip, lumbar spine   PONV (postoperative nausea and vomiting)    Rhinitis    Rosacea    Shingles    Tinea unguium    Tortuous aorta (HCC)    Vertigo     Past Surgical History: Past Surgical History:  Procedure Laterality Date   ABDOMINAL HYSTERECTOMY     BREAST CYST ASPIRATION     COLONOSCOPY N/A 05/31/2015   Procedure: COLONOSCOPY;  Surgeon: Lamar ONEIDA Holmes, MD;  Location: [facility] ENDOSCOPY;  Service: Endoscopy;  Laterality: N/A;   COLONOSCOPY WITH PROPOFOL  N/A 04/16/2022   Procedure: COLONOSCOPY WITH PROPOFOL ;  Surgeon: Onita Elspeth Sharper, DO;  Location: [facility] ENDOSCOPY;   Service: Gastroenterology;  Laterality: N/A;   ESOPHAGOGASTRODUODENOSCOPY  02/16/2003   FRACTURE SURGERY     HIP ARTHROPLASTY Left    JOINT REPLACEMENT     hip   NOSE SURGERY     SHOULDER ARTHROSCOPY Right    WRIST FRACTURE SURGERY      Allergies: Allergies as of 08/15/2024 - Review Complete 08/15/2024  Allergen Reaction Noted   Bee venom Swelling 05/30/2015   Codeine Nausea And Vomiting 05/10/2015   Nsaids Other (See Comments) 05/30/2015   Xanax [alprazolam] Other (See Comments) 05/30/2015    Medications: Outpatient Encounter Medications as of 08/15/2024  Medication Sig   albuterol  (PROVENTIL  HFA;VENTOLIN  HFA) 108 (90 BASE) MCG/ACT inhaler Inhale 2 puffs into the lungs every 6 (six) hours as needed for wheezing or shortness of breath. ProAir    aspirin EC 81 MG tablet Take 81 mg by mouth daily.   cholecalciferol (VITAMIN D) 1000 UNITS tablet Take 1,000 Units by mouth daily.   escitalopram (LEXAPRO) 10 MG tablet Take 10 mg by mouth daily.   fluticasone (FLONASE) 50 MCG/ACT nasal spray Place 2 sprays into both nostrils daily.   losartan (COZAAR) 50 MG tablet Take 50 mg by mouth daily.   montelukast (SINGULAIR) 10 MG tablet Take 10 mg by mouth at bedtime.   Multiple Vitamins-Minerals (  PRESERVISION/LUTEIN) CAPS Take 1 capsule by mouth 2 (two) times daily.   Omega-3 Fatty Acids (FISH OIL) 1200 MG CAPS Take 1,200 mg by mouth 2 (two) times daily.   omeprazole (PRILOSEC OTC) 20 MG tablet Take 20 mg by mouth.   [DISCONTINUED] hydrochlorothiazide (HYDRODIURIL) 25 MG tablet Take 25 mg by mouth daily.   [DISCONTINUED] loratadine (CLARITIN) 10 MG tablet Take by mouth.   [DISCONTINUED] Magnesium 200 MG CHEW Chew by mouth at bedtime.   [DISCONTINUED] methylPREDNISolone  (MEDROL  DOSEPAK) 4 MG TBPK tablet Take 6 pills on day one then decrease by 1 pill each day   [DISCONTINUED] omeprazole (PRILOSEC) 20 MG capsule Take 20 mg by mouth daily.   [DISCONTINUED] ondansetron  (ZOFRAN -ODT) 4 MG  disintegrating tablet Take 4 mg by mouth every 8 (eight) hours as needed. for nausea   No facility-administered encounter medications on file as of 08/15/2024.    Social History: Social History   Tobacco Use   Smoking status: Never   Smokeless tobacco: Never  Vaping Use   Vaping status: Never Used  Substance Use Topics   Alcohol use: Yes    Alcohol/week: 1.0 standard drink of alcohol    Types: 1 Shots of liquor per week    Comment: socially   Drug use: Never    Family Medical History: Family History  Problem Relation Age of Onset   Osteoarthritis Mother    Parkinsonism Mother    Heart attack Mother    Heart disease Father    Lung cancer Father    Ulcers Father    Diabetes type II Father    Lung cancer Sister    Ulcers Sister    Heart disease Sister    COPD Sister    Cervical cancer Sister    Gout Sister    Hypertension Sister    Asthma Brother    Colon polyps Brother    Breast cancer Maternal Aunt     Physical Examination: @VITALWITHPAIN @  General: Patient is well developed, well nourished, calm, collected, and in no apparent distress. Attention to examination is appropriate.  Psychiatric: Patient is non-anxious.  Head:  Pupils equal, round, and reactive to light.  ENT:  Oral mucosa appears well hydrated.  Neck:   Supple.  Full range of motion.  Respiratory: Patient is breathing without any difficulty.  Extremities: No edema.  Vascular: Palpable dorsal pedal pulses.  Skin:   On exposed skin, there are no abnormal skin lesions.  NEUROLOGICAL:     Awake, alert, oriented to person, place, and time.  Speech is clear and fluent. Fund of knowledge is appropriate.   Cranial Nerves: Pupils equal round and reactive to light.  Slight droop in left eyelid which patient and her husband state is chronic.  Facial sensation is symmetric.  Cranial nerves are grossly intact.  No pronator drift was noted.  ROM of spine: Full    Strength: Side Biceps Triceps  Deltoid Interossei Grip Wrist Ext. Wrist Flex.  R 5 5 5 5 5 5 5   L 5 5 5 5 5 5 5     Hoffman's is absent.  Clonus is not present.  Toes are down-going.  Bilateral upper and lower extremity sensation is intact to light touch.    Gait is normal.   No difficulty with tandem gait.   No evidence of dysmetria noted.  Medical Decision Making  Imaging: 08/03/24 CT Head:  IMPRESSION: 1. Essentially resolved subdural hemorrhage along the tentorium. 2. No evidence of a new intracranial abnormality.  I  have personally reviewed the images and agree with the above interpretation.  Assessment and Plan: Ms. Pellot is a pleasant 78 y.o. female is here today after suffering a fall on 07/13/2024.  She was found to have a subdural hemorrhage.  Overall, she is doing well.  She continues to have some postconcussive symptoms including dizziness which may be related to her blood pressure control.  Otherwise is doing well.  She denies any headaches at this time.  Denies any new falls since initial injury.  On examination she is to baseline.  Most recent CT scan was reviewed which showed essentially resolved subdural hemorrhage.  Pleasure to see patient in clinic today.  She is doing very well.  Red flag symptoms were reviewed with her and her husband.  Patient instructed to go to the nearest emergency department if she has any neurological changes.  The length of postconcussive symptoms were discussed.  All questions were answered.  Plan to see patient in follow-up on an as-needed basis.   Thank you for involving me in the care of this patient.   I spent a total of 45 minutes in both face-to-face and non-face-to-face activities for this visit on the date of this encounter including preparing to see the patient, obtaining and reviewing separately obtained history, performing medically appropriate examination, counseling the patient and their family,  documenting clinical information, independently interpreting  results.  Lyle Decamp, PA-C Dept. of Neurosurgery

## 2024-08-11 ENCOUNTER — Telehealth: Payer: Self-pay | Admitting: Physician Assistant

## 2024-08-11 NOTE — Telephone Encounter (Signed)
 Patient called stating she has an appointment with us  on August 19th.  She is supposed to have a dental procedure on the day before. The procedure requires for her to have NOVOCAINE which causes her to have headaches. She wants to know if we think its best to cancel the procedure at this time so nothing is affecting the things we do here? Or is she okay to have the procedure

## 2024-08-14 NOTE — Telephone Encounter (Signed)
 Patient notified and voiced understanding.

## 2024-08-15 ENCOUNTER — Ambulatory Visit: Admitting: Physician Assistant

## 2024-08-15 ENCOUNTER — Encounter: Payer: Self-pay | Admitting: Physician Assistant

## 2024-08-15 VITALS — BP 172/98 | Ht 65.0 in | Wt 170.0 lb

## 2024-08-15 DIAGNOSIS — W19XXXA Unspecified fall, initial encounter: Secondary | ICD-10-CM | POA: Diagnosis not present

## 2024-08-15 DIAGNOSIS — S065XAA Traumatic subdural hemorrhage with loss of consciousness status unknown, initial encounter: Secondary | ICD-10-CM | POA: Diagnosis not present

## 2024-08-15 DIAGNOSIS — R42 Dizziness and giddiness: Secondary | ICD-10-CM

## 2024-08-16 ENCOUNTER — Ambulatory Visit: Admitting: Physician Assistant

## 2024-08-26 ENCOUNTER — Ambulatory Visit: Payer: Self-pay | Admitting: Neurosurgery
